# Patient Record
Sex: Female | Born: 1980
Health system: Southern US, Community
[De-identification: ages and names within clinical notes are randomized; demographics above are authoritative.]

## PROBLEM LIST (undated history)

## (undated) DIAGNOSIS — IMO0002 Reserved for concepts with insufficient information to code with codable children: Secondary | ICD-10-CM

## (undated) DIAGNOSIS — R590 Localized enlarged lymph nodes: Secondary | ICD-10-CM

## (undated) DIAGNOSIS — M1990 Unspecified osteoarthritis, unspecified site: Secondary | ICD-10-CM

## (undated) DIAGNOSIS — J302 Other seasonal allergic rhinitis: Secondary | ICD-10-CM

## (undated) DIAGNOSIS — Z973 Presence of spectacles and contact lenses: Secondary | ICD-10-CM

## (undated) DIAGNOSIS — R229 Localized swelling, mass and lump, unspecified: Secondary | ICD-10-CM

## (undated) HISTORY — DX: Unspecified osteoarthritis, unspecified site: M19.90

## (undated) HISTORY — PX: BREAST SURGERY: SHX581

## (undated) HISTORY — PX: REDUCTION MAMMAPLASTY: SUR839

---

## 2003-11-13 ENCOUNTER — Emergency Department (HOSPITAL_COMMUNITY): Admission: EM | Admit: 2003-11-13 | Discharge: 2003-11-13 | Payer: Self-pay | Admitting: Emergency Medicine

## 2003-12-04 HISTORY — PX: REDUCTION MAMMAPLASTY: SUR839

## 2005-08-19 ENCOUNTER — Emergency Department (HOSPITAL_COMMUNITY): Admission: EM | Admit: 2005-08-19 | Discharge: 2005-08-19 | Payer: Self-pay | Admitting: Emergency Medicine

## 2006-05-14 ENCOUNTER — Emergency Department (HOSPITAL_COMMUNITY): Admission: EM | Admit: 2006-05-14 | Discharge: 2006-05-14 | Payer: Self-pay | Admitting: Emergency Medicine

## 2006-08-22 ENCOUNTER — Other Ambulatory Visit: Admission: RE | Admit: 2006-08-22 | Discharge: 2006-08-22 | Payer: Self-pay | Admitting: *Deleted

## 2007-10-16 ENCOUNTER — Emergency Department (HOSPITAL_COMMUNITY): Admission: EM | Admit: 2007-10-16 | Discharge: 2007-10-16 | Payer: Self-pay | Admitting: Emergency Medicine

## 2011-09-11 LAB — RAPID STREP SCREEN (MED CTR MEBANE ONLY): Streptococcus, Group A Screen (Direct): NEGATIVE

## 2011-11-12 ENCOUNTER — Emergency Department (INDEPENDENT_AMBULATORY_CARE_PROVIDER_SITE_OTHER)
Admission: EM | Admit: 2011-11-12 | Discharge: 2011-11-12 | Disposition: A | Discharge status: Home / Self Care | Payer: 59 | Source: Home / Self Care | Attending: Family Medicine | Admitting: Family Medicine

## 2011-11-12 ENCOUNTER — Emergency Department (INDEPENDENT_AMBULATORY_CARE_PROVIDER_SITE_OTHER): Payer: 59

## 2011-11-12 DIAGNOSIS — J069 Acute upper respiratory infection, unspecified: Secondary | ICD-10-CM

## 2011-11-12 MED ORDER — GUAIFENESIN-CODEINE 100-10 MG/5ML PO SYRP: 10.0000 mL | ORAL_SOLUTION | Freq: Three times a day (TID) | ORAL | Status: AC | PRN

## 2011-11-12 MED ORDER — IPRATROPIUM BROMIDE 0.06 % NA SOLN: 2.0000 | Freq: Four times a day (QID) | NASAL | Status: DC

## 2011-11-12 NOTE — ED Notes (Signed)
C/o general body aches, ST, cough; minimal relief w OTC medications

## 2011-11-12 NOTE — ED Provider Notes (Signed)
History     CSN: [id number] Arrival date & time: 11/12/2011  3:21 PM   First MD Initiated Contact with Patient 11/12/11 1343      Chief Complaint  Patient presents with  . Cough    (Consider location/radiation/quality/duration/timing/severity/associated sxs/prior treatment) Patient is a 30 y.o. female presenting with cough. The history is provided by the patient.  Cough This is a new problem. The current episode started more than 1 week ago. The problem has not changed since onset.The cough is productive of sputum. The maximum temperature recorded prior to her arrival was 100 to 100.9 F. The fever has been present for 1 to 2 days. Associated symptoms include chest pain, ear pain, rhinorrhea, sore throat and myalgias. Pertinent negatives include no shortness of breath and no wheezing. She is not a smoker.    History reviewed. No pertinent past medical history.  Past Surgical History  Procedure Date  . Breast surgery     History reviewed. No pertinent family history.  History  Substance Use Topics  . Smoking status: Never Smoker   . Smokeless tobacco: Not on file  . Alcohol Use: Yes     occasional    OB History    Grav Para Term Preterm Abortions TAB SAB Ect Mult Living                  Review of Systems  Constitutional: Positive for fever.  HENT: Positive for ear pain, congestion, sore throat, rhinorrhea and postnasal drip.   Respiratory: Positive for cough. Negative for shortness of breath and wheezing.   Cardiovascular: Positive for chest pain.  Gastrointestinal: Negative.   Genitourinary: Negative.   Musculoskeletal: Positive for myalgias.  Skin: Negative.     Allergies  Review of patient's allergies indicates no known allergies.  Home Medications   Current Outpatient Rx  Name Route Sig Dispense Refill  . GUAIFENESIN-CODEINE 100-10 MG/5ML PO SYRP Oral Take 10 mLs by mouth 3 (three) times daily as needed for cough. 180 mL 0  . IPRATROPIUM BROMIDE 0.06 %  NA SOLN Nasal Place 2 sprays into the nose 4 (four) times daily. 15 mL 12    BP 139/98  Pulse 87  Temp(Src) 98.1 F (36.7 C) (Oral)  Resp 16  SpO2 97%  LMP 10/18/2011  Physical Exam  Nursing note and vitals reviewed. Constitutional: She appears well-developed and well-nourished.  HENT:  Head: Normocephalic.  Right Ear: External ear normal.  Left Ear: External ear normal.  Mouth/Throat: Oropharynx is clear and moist.  Eyes: Conjunctivae and EOM are normal. Pupils are equal, round, and reactive to light.  Neck: Normal range of motion. Neck supple.  Cardiovascular: Normal rate, normal heart sounds and intact distal pulses.   Pulmonary/Chest: Effort normal and breath sounds normal.  Abdominal: Soft. Bowel sounds are normal.  Skin: Skin is warm and dry.    ED Course  Procedures (including critical care time)  Labs Reviewed - No data to display Dg Chest 2 View  11/12/2011  *RADIOLOGY REPORT*  Clinical Data: Fever and congestion.  CHEST - 2 VIEW  Comparison: None.  Findings: No infiltrate, congestive heart failure or pneumothorax. Mild central pulmonary vessel prominence.  Heart size within normal limits.  IMPRESSION: No infiltrate.  Original Report Authenticated By: Fuller Canada, M.D.     1. Upper respiratory infection       MDM  X-rays reviewed and report per radiologist.         Barkley Bruns, MD 11/12/11 1630

## 2011-11-20 ENCOUNTER — Telehealth (HOSPITAL_COMMUNITY): Payer: Self-pay | Admitting: *Deleted

## 2015-08-11 ENCOUNTER — Other Ambulatory Visit (HOSPITAL_COMMUNITY): Payer: Self-pay | Admitting: Otolaryngology

## 2015-08-11 DIAGNOSIS — R221 Localized swelling, mass and lump, neck: Secondary | ICD-10-CM

## 2015-08-17 ENCOUNTER — Emergency Department (HOSPITAL_COMMUNITY): Payer: 59

## 2015-08-17 ENCOUNTER — Encounter (HOSPITAL_COMMUNITY): Payer: Self-pay

## 2015-08-17 ENCOUNTER — Ambulatory Visit (HOSPITAL_COMMUNITY)
Admission: RE | Admit: 2015-08-17 | Discharge: 2015-08-17 | Disposition: A | Discharge status: Home / Self Care | Payer: 59 | Source: Ambulatory Visit | Attending: Otolaryngology | Admitting: Otolaryngology

## 2015-08-17 ENCOUNTER — Emergency Department (HOSPITAL_COMMUNITY)
Admission: EM | Admit: 2015-08-17 | Discharge: 2015-08-17 | Disposition: A | Discharge status: Home / Self Care | Payer: 59 | Attending: Emergency Medicine | Admitting: Emergency Medicine

## 2015-08-17 DIAGNOSIS — J029 Acute pharyngitis, unspecified: Secondary | ICD-10-CM | POA: Diagnosis present

## 2015-08-17 DIAGNOSIS — Z79899 Other long term (current) drug therapy: Secondary | ICD-10-CM | POA: Diagnosis not present

## 2015-08-17 DIAGNOSIS — R599 Enlarged lymph nodes, unspecified: Secondary | ICD-10-CM

## 2015-08-17 DIAGNOSIS — R21 Rash and other nonspecific skin eruption: Secondary | ICD-10-CM

## 2015-08-17 DIAGNOSIS — Z792 Long term (current) use of antibiotics: Secondary | ICD-10-CM | POA: Insufficient documentation

## 2015-08-17 DIAGNOSIS — R221 Localized swelling, mass and lump, neck: Secondary | ICD-10-CM

## 2015-08-17 LAB — CBC WITH DIFFERENTIAL/PLATELET
Basophils Absolute: 0 K/uL (ref 0.0–0.1)
Basophils Relative: 0 %
Eosinophils Absolute: 0 K/uL (ref 0.0–0.7)
Eosinophils Relative: 0 %
HCT: 39.3 % (ref 36.0–46.0)
Hemoglobin: 13 g/dL (ref 12.0–15.0)
Lymphocytes Relative: 14 %
Lymphs Abs: 1.5 K/uL (ref 0.7–4.0)
MCH: 29.9 pg (ref 26.0–34.0)
MCHC: 33.1 g/dL (ref 30.0–36.0)
MCV: 90.3 fL (ref 78.0–100.0)
Monocytes Absolute: 0.5 K/uL (ref 0.1–1.0)
Monocytes Relative: 4 %
Neutro Abs: 8.7 K/uL — ABNORMAL HIGH (ref 1.7–7.7)
Neutrophils Relative %: 81 %
Platelets: 543 K/uL — ABNORMAL HIGH (ref 150–400)
RBC: 4.35 MIL/uL (ref 3.87–5.11)
RDW: 12.6 % (ref 11.5–15.5)
WBC: 10.7 K/uL — ABNORMAL HIGH (ref 4.0–10.5)

## 2015-08-17 LAB — I-STAT CG4 LACTIC ACID, ED: Lactic Acid, Venous: 1.24 mmol/L (ref 0.5–2.0)

## 2015-08-17 LAB — BASIC METABOLIC PANEL WITH GFR
Anion gap: 8 (ref 5–15)
BUN: 11 mg/dL (ref 6–20)
CO2: 26 mmol/L (ref 22–32)
Calcium: 9.1 mg/dL (ref 8.9–10.3)
Chloride: 102 mmol/L (ref 101–111)
Creatinine, Ser: 0.89 mg/dL (ref 0.44–1.00)
GFR calc Af Amer: >60 mL/min
GFR calc non Af Amer: >60 mL/min
Glucose, Bld: 127 mg/dL — ABNORMAL HIGH (ref 65–99)
Potassium: 3.8 mmol/L (ref 3.5–5.1)
Sodium: 136 mmol/L (ref 135–145)

## 2015-08-17 MED ORDER — TRIAMCINOLONE ACETONIDE 0.1 % EX CREA: 1.0000 "application " | TOPICAL_CREAM | Freq: Two times a day (BID) | CUTANEOUS | Status: DC

## 2015-08-17 MED ORDER — IOHEXOL 300 MG/ML  SOLN
75.0000 mL | Freq: Once | INTRAMUSCULAR | Status: AC | PRN
  Administered 2015-08-17: 75 mL via INTRAVENOUS

## 2015-08-17 MED ORDER — IOHEXOL 300 MG/ML  SOLN
75.0000 mL | Freq: Once | INTRAMUSCULAR | Status: DC | PRN
Start: 2015-08-17 — End: 2015-08-18

## 2015-08-17 NOTE — ED Provider Notes (Signed)
CSN: [id number]     Arrival date & time 08/17/15  1306 History   First MD Initiated Contact with Patient 08/17/15 1523     Chief Complaint  Patient presents with  . Sore Throat     (Consider location/radiation/quality/duration/timing/severity/associated sxs/prior Treatment) HPI Comments: Jennifer Gutierrez is a 34 y.o F with no significant past medical history presented to the emergency department today complaining of sore throat and rash. 2 weeks ago patient noticed a painful lump on her left neck so patient saw her PCP who tested her for mono and strep which are both negative. PCP place patient on Augmentin. Patient then saw ENT last Thursday. He biopsies of the nodule and place patient on doxycycline as well as prednisone. Patient states that beginning 2 weeks ago up until this past Monday she had intermittent fevers reaching 102. Patient states that this Monday she started noticing a rash that began on her bilateral lower extremities and trunk that she stop taking the doxycycline. Patient has never taken doxycycline before this time. Yesterday morning patient states the rash on her lower extremities got much worse. Her legs are now hot to the touch and bright red and swollen. Rash is mildly pruritic. Patient came to hospital to have CT of the neck done as ordered by ENT physician. However imaging tech was afraid to give her contrast dye due to her rash so she sent her to the emergency department. Patient denies active fever, chills, difficulty swallowing, difficulty breathing, chest pain, nausea, vomiting, diarrhea, weakness, numbness, tingling, headache, confusion.  The history is provided by the patient.    History reviewed. No pertinent past medical history. Past Surgical History  Procedure Laterality Date  . Breast surgery     No family history on file. Social History  Substance Use Topics  . Smoking status: Never Smoker   . Smokeless tobacco: None  . Alcohol Use: Yes     Comment:  occasional   OB History    No data available     Review of Systems  All other systems reviewed and are negative.     Allergies  Review of patient's allergies indicates no known allergies.  Home Medications   Prior to Admission medications   Medication Sig Start Date End Date Taking? Authorizing Provider  doxycycline (VIBRAMYCIN) 100 MG capsule Take 1 capsule by mouth 2 (two) times daily. 08/11/15  Yes Historical Provider, MD  HYDROcodone-acetaminophen (NORCO/VICODIN) 5-325 MG per tablet Take 1 tablet by mouth every 6 (six) hours as needed. pain 08/11/15  Yes Historical Provider, MD  NUVARING 0.12-0.015 MG/24HR vaginal ring Place 1 each vaginally every 28 (twenty-eight) days. 07/18/15  Yes Historical Provider, MD  predniSONE (DELTASONE) 10 MG tablet Take 1 tablet by mouth. Take 4 tabs daily x2 days, 3 tabs daily x2 days, 2 tabs daily x2 days, 1 tab daily x2 days 08/04/15  Yes Historical Provider, MD  ipratropium (ATROVENT) 0.06 % nasal spray Place 2 sprays into the nose 4 (four) times daily. 11/12/11 11/11/12  Linna Hoff, MD   BP 145/89 mmHg  Pulse 86  Temp(Src) 98.1 F (36.7 C) (Oral)  Resp 16  SpO2 100%  LMP 08/04/2015 (Approximate) Physical Exam  Constitutional: She is oriented to person, place, and time. She appears well-developed and well-nourished. No distress.  HENT:  Head: Normocephalic and atraumatic.  Mouth/Throat: Oropharynx is clear and moist. No oropharyngeal exudate.  No tonsilar exudate. Buccal mucosa non-erythematous.  Eyes: Conjunctivae and EOM are normal. Pupils are equal, round, and reactive  to light. Right eye exhibits no discharge. Left eye exhibits no discharge. No scleral icterus.  Neck: Normal range of motion. Neck supple. No JVD present. No tracheal deviation present. No thyromegaly present.  Nodule appreciated in left side of neck, with TTP. Non-fluctuant mass. Non-erythematous.   Cardiovascular: Normal rate, regular rhythm, normal heart sounds and intact  distal pulses.  Exam reveals no gallop and no friction rub.   No murmur heard. Pulmonary/Chest: Effort normal and breath sounds normal. No stridor. No respiratory distress. She has no wheezes. She has no rales. She exhibits no tenderness.  Abdominal: Soft. Bowel sounds are normal. She exhibits no distension and no mass. There is no tenderness. There is no rebound and no guarding.  Musculoskeletal: Normal range of motion. She exhibits no edema or tenderness.  Lymphadenopathy:    She has no cervical adenopathy.  Neurological: She is alert and oriented to person, place, and time. No cranial nerve deficit.  Strength 5/5 throughout. No sensory deficits.  No gait abnormality.   Skin: Skin is warm and dry. Rash noted. She is not diaphoretic. No erythema. No pallor.  Diffuse macular-papular rash across bilateral lower extremities and abdomen. Below the knee on bilateral LE rash is also beefy, circumferential, and warm to the touch.  Psychiatric: She has a normal mood and affect. Her behavior is normal.  Nursing note and vitals reviewed.   ED Course  Procedures (including critical care time) Labs Review Labs Reviewed  BASIC METABOLIC PANEL - Abnormal; Notable for the following:    Glucose, Bld 127 (*)    All other components within normal limits  CBC WITH DIFFERENTIAL/PLATELET - Abnormal; Notable for the following:    WBC 10.7 (*)    Platelets 543 (*)    Neutro Abs 8.7 (*)    All other components within normal limits  I-STAT CG4 LACTIC ACID, ED    Imaging Review Ct Soft Tissue Neck W Contrast  08/17/2015   CLINICAL DATA:  Left-sided neck swelling.  EXAM: CT NECK WITH CONTRAST  TECHNIQUE: Multidetector CT imaging of the neck was performed using the standard protocol following the bolus administration of intravenous contrast.  CONTRAST:  75mL OMNIPAQUE IOHEXOL 300 MG/ML  SOLN  COMPARISON:  None.  FINDINGS: Pharynx and larynx: Mild mucosal thickening is present at the tongue base. No discrete  lesion is present. The vocal cords are midline and symmetric.  Salivary glands: Small lymph nodes are present at the superior aspect of the parotid glands bilaterally. The salivary glands are otherwise within normal limits.  Thyroid: Negative  Lymph nodes: Multiple enlarged left cervical chain nodes are present. A large nodes are at the left level 3 station, measuring up to 15 x 7 mm. There is inflammatory change surrounding knees nodes. This creates some mass effect on the left internal jugular vein without obstruction. No discrete abscess is present. Smaller right-sided nodes are present without inflammatory stranding.  Vascular: External compression on the left internal jugular vein is noted without obstruction. No significant atherosclerotic changes are present.  Limited intracranial: Limited imaging the brain is within normal limits.  Visualized orbits: Within normal limits.  Mastoids and visualized paranasal sinuses: The paranasal sinuses and mastoid air cells are clear.  Skeleton: Negative.  Upper chest: Lung apices are clear. Inflammatory changes are present within the left middle and posterior scalene muscles. Laboratory changes are noted along the medial aspect of the left sternocleidomastoid muscle.  IMPRESSION: 1. Multiple reactive type left level 2, level 3, and level 4 lymph  nodes extending to the thoracic inlet. 2. Marked inflammatory changes are present between these nodes. 3. Inflammatory changes within the left middle and posterior scalene musculature suggesting myositis. No discrete abscess is evident. 4. Mild irregularity of the mucosa at the tongue base may represent a pharyngitis. There is no discrete abscess or mass lesion.   Electronically Signed   By: Marin Roberts M.D.   On: 08/17/2015 17:48   I have personally reviewed and evaluated these images and lab results as part of my medical decision-making.   EKG Interpretation None      MDM   Final diagnoses:  Rash  Lymph  nodes enlarged    Pt seen for bilateral LE rash likely due to an allergic reaction to doxycycline. Will discontinue. Spoke to Dr. Emeline Darling with ENT who recommends outpatient biopsy of neck nodule. Discontinuation of doxycycline,  continue taking prednisone and pain management pills as needed for pain. CT reveals enlarged lymph nodes in left neck. This has been addressed with ENT who will contact patient tomorrow to schedule outpatient biopsy. Vital signs stable. We will give triamcinolone cream for rash. Patient may take Benadryl at home when necessary. Discussed with patient who is agreeable. Return precautions outlined in patient discharge instructions.  Patient was discussed with  Dr. Clydene Pugh who agrees with the treatment plan.      Dub Mikes, PA-C 08/17/15 1825  Lester Kinsman [place], PA-C 08/17/15 1826  Lyndal Pulley, MD 08/18/15 [phone]

## 2015-08-17 NOTE — Progress Notes (Signed)
Patient was seen in the office last week for left neck swelling, had office FNA that was nondiagnostic. Went to have neck CT today and was sent to ER for a rash from her Doxycycline that she has since stopped. I reviewed her neck CT scan and this shows several enlarged left level 2-4 cervical nodes and an increased number of bilateral level 2-5 nodes, but no obvious thyroid or oral cavity masses on my review and no abscesses or cysts. Since the left neck mass appears to be lymphadenopathy without abscess I discussed with the ER provider that the patient can just stop her antibiotic, finish the prednisone taper as tolerated, and I will defer to the ER on treatment of the rash. I will contact the patient with the CT scan results once Radiology has finished the final reading. The patient will likely need an open biopsy of the left neck lymph nodes and my office can contact her to set this up as an outpatient. Plan was discussed with the ED physician.  Melvenia Beam, MD  5:39 PM  08/17/2015

## 2015-08-17 NOTE — Discharge Instructions (Signed)
Drug Rash Skin reactions can be caused by several different drugs. Allergy to the medicine can cause itching, hives, and other rashes. Sun exposure causes a red rash with some medicines. Mononucleosis virus can cause a similar red rash when you are taking antibiotics. Sometimes, the rash may be accompanied by pain. The drug rash may happen with new drugs or with medicines that you have been taking for a while. The rash cannot be spread from person to person. In most cases, the symptoms of a drug rash are gone within a few days of stopping the medicine. Your rash, including hives (urticaria), is most likely from the following medicines:  Antibiotics or antimicrobials.  Anticonvulsants or seizure medicines.  Antihypertensives or blood pressure medicines.  Antimalarials.  Antidepressants or depression medicines.  Antianxiety drugs.  Diuretics or water pills.  Nonsteroidal anti-inflammatory drugs.  Simvastatin.  Lithium.  Omeprazole.  Allopurinol.  Pseudoephedrine.  Amiodarone.  Packed red blood cells, when you get a blood transfusion.  Contrast media, such as when getting an imaging test (CT or CAT scan). This drug list is not all inclusive, but drug rashes have been reported with all the medicines listed above. Your caregiver will tell you which medicines to avoid. If you react to a medicine, a similar or worse reaction can occur the next time you take it. If you need to stop taking an antibiotic because of a drug rash, an alternative antibiotic may be needed to get rid of your infection. Antihistamine or cortisone drugs may be prescribed to help relieve your symptoms. Stay out of the sun until the rash is completely gone.  Be sure to let your caregiver know about your drug reaction. Do not take this medicine in the future. Call your caregiver if your drug rash does not improve within 3 to 4 days. SEEK IMMEDIATE MEDICAL CARE IF:   You develop breathing problems, swelling in the  throat, or wheezing.  You have weakness, fainting, fever, and muscle or joint pains.  You develop blisters or peeling of skin, especially around the mouth. Document Released: 12/27/2004 Document Revised: 04/05/2014 Document Reviewed: 10/07/2008 [facility] Patient Information 2015 [place], [place]. This information is not intended to replace advice given to you by your health care provider. Make sure you discuss any questions you have with your health care provider.  Drug Allergy A drug allergy means you have a strange reaction to a medicine. You may have puffiness (swelling), itching, red rashes, and hives. Some allergic reactions can be life-threatening. HOME CARE  If you do not know what caused your reaction:  Write down medicines you use.  Write down any problems you have after using medicine.  Avoid things that cause a reaction.  You can see an allergy doctor to be tested for allergies. If you have hives or a rash:  Take medicine as told by your doctor.  Place cold cloths on your skin.  Do not take hot baths or hot showers. Take baths in cool water. If you are severely allergic:  Wear a medical bracelet or necklace that lists your allergy.  Carry your allergy kit or medicine shot to treat severe allergic reactions with you. These can save your life.  Do not drive until medicine from your shot has worn off, unless your doctor says it is okay. GET HELP RIGHT AWAY IF:   Your mouth is puffy, or you have trouble breathing.  You have a tight feeling in your chest or throat.  You have hives, puffiness, or itching  all over your body.  You throw up (vomit) or have watery poop (diarrhea).  You feel dizzy or pass out (faint).  You think you are having a reaction. Problems often start within 30 minutes after taking a medicine.  You are getting worse, not better.  You have new problems.  Your problems go away and then come back. This is an emergency. Use your medicine shot or  allergy kit as told. Call yourlocal emergency services (911 in [place] after the shot. Even if you feel better after the shot, you need to go to the hospital. You may need more medicine to control a severe reaction. MAKE SURE YOU:  Understand these instructions.  Will watch your condition.  Will get help right away if you are not doing well or get worse. Document Released: 12/27/2004 Document Revised: 02/11/2012 Document Reviewed: 05/17/2011 [facility] Patient Information 2015 [place], [place]. This information is not intended to replace advice given to you by your health care provider. Make sure you discuss any questions you have with your health care provider.  Discontinue doxycycline. Continue taking prednisone and pain pills as needed. Follow up with ENT to schedule outpatient biopsy. Return to the emergency department if rash worsens or spreads or if you experience fevers, vomiting, diarrhea, difficulty swallowing, or difficulty breathing occur.

## 2015-08-17 NOTE — ED Notes (Signed)
She c/o swelling of left neck area which she feels on the inside of here neck.  She is having no difficulty with breathing or air movement.  She states the area of swelling on her neck has appeared over the past two weeks.  She states she saw her pcp and an ent and is currently on doxy and pain med.  She has a red, beefy rash of bilat. Lower extremities and a red rash to a muc lesser extent of trunk and upper extremities.

## 2015-09-05 ENCOUNTER — Other Ambulatory Visit (HOSPITAL_COMMUNITY): Payer: Self-pay | Admitting: Otolaryngology

## 2015-09-14 ENCOUNTER — Encounter (HOSPITAL_COMMUNITY)
Admission: RE | Admit: 2015-09-14 | Discharge: 2015-09-14 | Disposition: A | Discharge status: Home / Self Care | Payer: 59 | Source: Ambulatory Visit | Attending: Otolaryngology | Admitting: Otolaryngology

## 2015-09-14 ENCOUNTER — Encounter (HOSPITAL_COMMUNITY): Payer: Self-pay

## 2015-09-14 DIAGNOSIS — R590 Localized enlarged lymph nodes: Secondary | ICD-10-CM | POA: Diagnosis not present

## 2015-09-14 HISTORY — DX: Localized enlarged lymph nodes: R59.0

## 2015-09-14 HISTORY — DX: Other seasonal allergic rhinitis: J30.2

## 2015-09-14 HISTORY — DX: Presence of spectacles and contact lenses: Z97.3

## 2015-09-14 HISTORY — DX: Localized swelling, mass and lump, unspecified: R22.9

## 2015-09-14 HISTORY — DX: Reserved for concepts with insufficient information to code with codable children: IMO0002

## 2015-09-14 LAB — CBC
HCT: 35.5 % — ABNORMAL LOW (ref 36.0–46.0)
Hemoglobin: 11.9 g/dL — ABNORMAL LOW (ref 12.0–15.0)
MCH: 29.8 pg (ref 26.0–34.0)
MCHC: 33.5 g/dL (ref 30.0–36.0)
MCV: 89 fL (ref 78.0–100.0)
Platelets: 541 10*3/uL — ABNORMAL HIGH (ref 150–400)
RBC: 3.99 MIL/uL (ref 3.87–5.11)
RDW: 13.3 % (ref 11.5–15.5)
WBC: 7 10*3/uL (ref 4.0–10.5)

## 2015-09-14 LAB — HCG, SERUM, QUALITATIVE: Preg, Serum: NEGATIVE

## 2015-09-14 NOTE — Pre-Procedure Instructions (Signed)
    Jennifer Gutierrez  09/14/2015      [facility] [id number] [place]- [place], [place] - [address] AT [facility] [address] [facility] [place] [id number] Phone: [phone] Fax: [phone]    Your procedure is scheduled on Friday, September 16, 2015  [facility]  Call this number if you have problems the morning of surgery:  [phone]   Remember:  Do not eat food or drink liquids after midnight Thursday, September 15, 2015  Take these medicines the morning of surgery with A SIP OF WATER: None  Stop taking Aspirin, vitamins and herbal medications. Do not take any NSAIDs ie: Ibuprofen, Advil, Naproxen or any medication containing Aspirin; stop now.   Do not wear jewelry, make-up or nail polish.  Do not wear lotions, powders, or perfumes.  You may not wear deodorant.  Do not shave 48 hours prior to surgery.  Men may shave face and neck.  Do not bring valuables to the hospital.  [facility] is not responsible for any belongings or valuables.  Contacts, dentures or bridgework may not be worn into surgery.  Leave your suitcase in the car.  After surgery it may be brought to your room.  For patients admitted to the hospital, discharge time will be determined by your treatment team.  Patients discharged the day of surgery will not be allowed to drive home.   Name and phone number of your driver:   Special instructions:   Please read over the following fact sheets that you were given. Pain Booklet, Coughing and Deep Breathing and Surgical Site Infection Prevention

## 2015-09-15 NOTE — H&P (Signed)
09/16/15 7:20 AM  Jennifer Gutierrez, Jennifer Gutierrez  PREOPERATIVE HISTORY AND PHYSICAL  CHIEF COMPLAINT: left neck lymphadenopathy  HISTORY: This is a 34 year old who presents with persistent left neck lymphadenopathy that did not completely resolve with antibiotics and steroids.  She now presents for open left neck lymph node biopsy.  Dr. Emeline DarlingGore, Clovis RileyMitchell has discussed the risks (bleeding, infection, nerve injury, anesthesia risks, etc.), benefits, and alternatives of this procedure. The patient understands the risks and would like to proceed with the procedure. The chances of success of the procedure are >50% and the patient understands this. I personally performed an examination of the patient within 24 hours of the procedure.  PAST MEDICAL HISTORY: Past Medical History  Diagnosis Date  . Mass     left side of neck  . Cervical lymphadenopathy   . Wears glasses   . Seasonal allergies     PAST SURGICAL HISTORY: Past Surgical History  Procedure Laterality Date  . Breast surgery      reduction    MEDICATIONS: No current facility-administered medications on file prior to encounter.   Current Outpatient Prescriptions on File Prior to Encounter  Medication Sig Dispense Refill  . NUVARING 0.12-0.015 MG/24HR vaginal ring Place 1 each vaginally every 28 (twenty-eight) days.  13  . triamcinolone cream (KENALOG) 0.1 % Apply 1 application topically 2 (two) times daily. (Patient not taking: Reported on 09/13/2015) 30 g 0    ALLERGIES: Allergies  Allergen Reactions  . Doxycycline Hyclate Rash    SOCIAL HISTORY: Social History   Social History  . Marital Status: Single    Spouse Name: N/A  . Number of Children: N/A  . Years of Education: N/A   Occupational History  . Not on file.   Social History Main Topics  . Smoking status: Never Smoker   . Smokeless tobacco: Never Used  . Alcohol Use: Yes     Comment: occasional  . Drug Use: No  . Sexual Activity: Yes    Birth Control/ Protection:  Abstinence   Other Topics Concern  . Not on file   Social History Narrative    FAMILY HISTORY:  Family History  Problem Relation Age of Onset  . Cancer Mother   . Hypertension Mother   . Glaucoma Father   . Hypertension Father   . Leukemia Other     REVIEW OF SYSTEMS:  HEENT:left neck pain and swelling improved since original presentation, otherwise negative x 12 systems except per HPI   PHYSICAL EXAM:  GENERAL: NAD  VITAL SIGNS:   SKIN:  Warm, dry HEENT:  Oral cavity clear  NECK/LYMPH: several mildly tender ~ 1 to 2cm level II through IV nodes on the left  ABDOMEN:  soft MUSCULOSKELETAL: normal strength PSYCH:  Normal affect NEUROLOGIC:  CN 2-12 intact and symmetric  DIAGNOSTIC STUDIES: CT neck showed several enlarged left neck jugulodigastric nodes  ASSESSMENT AND PLAN: Plan to proceed with open left neck lymph node biopsy. Patient understands the risks, benefits, and alternatives. Informed written consent signed witnessed and on chart.  09/16/15 Jennifer Gutierrez, Jennifer Gutierrez  7:22 AM

## 2015-09-16 ENCOUNTER — Encounter (HOSPITAL_COMMUNITY)
Admission: RE | Disposition: A | Discharge status: Home / Self Care | Payer: Self-pay | Source: Ambulatory Visit | Attending: Otolaryngology

## 2015-09-16 ENCOUNTER — Ambulatory Visit (HOSPITAL_COMMUNITY): Payer: 59 | Admitting: Anesthesiology

## 2015-09-16 ENCOUNTER — Encounter (HOSPITAL_COMMUNITY): Payer: Self-pay | Admitting: Anesthesiology

## 2015-09-16 ENCOUNTER — Ambulatory Visit (HOSPITAL_COMMUNITY)
Admission: RE | Admit: 2015-09-16 | Discharge: 2015-09-16 | Disposition: A | Discharge status: Home / Self Care | Payer: 59 | Source: Ambulatory Visit | Attending: Otolaryngology | Admitting: Otolaryngology

## 2015-09-16 DIAGNOSIS — R590 Localized enlarged lymph nodes: Secondary | ICD-10-CM | POA: Insufficient documentation

## 2015-09-16 HISTORY — PX: LYMPH NODE BIOPSY: SHX201

## 2015-09-16 SURGERY — LYMPH NODE BIOPSY
Anesthesia: General | Site: Neck | Laterality: Left

## 2015-09-16 MED ORDER — KETOROLAC TROMETHAMINE 30 MG/ML IJ SOLN
INTRAMUSCULAR | Status: DC | PRN
  Administered 2015-09-16: 30 mg via INTRAVENOUS

## 2015-09-16 MED ORDER — PROPOFOL 10 MG/ML IV BOLUS
INTRAVENOUS | Status: AC
  Filled 2015-09-16: qty 20

## 2015-09-16 MED ORDER — LIDOCAINE-EPINEPHRINE 1 %-1:100000 IJ SOLN
INTRAMUSCULAR | Status: DC | PRN
  Administered 2015-09-16: 10 mL

## 2015-09-16 MED ORDER — ROCURONIUM BROMIDE 50 MG/5ML IV SOLN
INTRAVENOUS | Status: AC
  Filled 2015-09-16: qty 1

## 2015-09-16 MED ORDER — LIDOCAINE HCL (CARDIAC) 20 MG/ML IV SOLN
INTRAVENOUS | Status: DC | PRN
  Administered 2015-09-16: 80 mg via INTRAVENOUS

## 2015-09-16 MED ORDER — SUCCINYLCHOLINE 20MG/ML (10ML) SYRINGE FOR MEDFUSION PUMP - OPTIME
INTRAMUSCULAR | Status: DC | PRN
  Administered 2015-09-16: 100 mg via INTRAVENOUS

## 2015-09-16 MED ORDER — ONDANSETRON HCL 4 MG/2ML IJ SOLN
INTRAMUSCULAR | Status: DC | PRN
  Administered 2015-09-16: 4 mg via INTRAVENOUS

## 2015-09-16 MED ORDER — 0.9 % SODIUM CHLORIDE (POUR BTL) OPTIME
TOPICAL | Status: DC | PRN
  Administered 2015-09-16: 1000 mL

## 2015-09-16 MED ORDER — BACITRACIN ZINC 500 UNIT/GM EX OINT
TOPICAL_OINTMENT | CUTANEOUS | Status: DC | PRN
  Administered 2015-09-16: 1 via TOPICAL

## 2015-09-16 MED ORDER — SUCCINYLCHOLINE CHLORIDE 20 MG/ML IJ SOLN
INTRAMUSCULAR | Status: AC
  Filled 2015-09-16: qty 1

## 2015-09-16 MED ORDER — BACITRACIN ZINC 500 UNIT/GM EX OINT
TOPICAL_OINTMENT | CUTANEOUS | Status: AC
  Filled 2015-09-16: qty 28.35

## 2015-09-16 MED ORDER — PHENYLEPHRINE HCL 10 MG/ML IJ SOLN
10000.0000 ug | INTRAMUSCULAR | Status: DC | PRN
  Administered 2015-09-16: 80 ug via INTRAVENOUS
  Administered 2015-09-16 (×2): 40 ug via INTRAVENOUS
  Administered 2015-09-16: 80 ug via INTRAVENOUS

## 2015-09-16 MED ORDER — HEMOSTATIC AGENTS (NO CHARGE) OPTIME
TOPICAL | Status: DC | PRN
  Administered 2015-09-16: 1 via TOPICAL

## 2015-09-16 MED ORDER — MIDAZOLAM HCL 2 MG/2ML IJ SOLN
INTRAMUSCULAR | Status: AC
  Filled 2015-09-16: qty 4

## 2015-09-16 MED ORDER — FENTANYL CITRATE (PF) 250 MCG/5ML IJ SOLN
INTRAMUSCULAR | Status: AC
  Filled 2015-09-16: qty 5

## 2015-09-16 MED ORDER — LACTATED RINGERS IV SOLN
INTRAVENOUS | Status: DC | PRN
  Administered 2015-09-16: 08:00:00 via INTRAVENOUS

## 2015-09-16 MED ORDER — CLINDAMYCIN PHOSPHATE 600 MG/50ML IV SOLN
600.0000 mg | Freq: Once | INTRAVENOUS | Status: AC
  Administered 2015-09-16: 600 mg via INTRAVENOUS
  Filled 2015-09-16 (×2): qty 50

## 2015-09-16 MED ORDER — LIDOCAINE-EPINEPHRINE 1 %-1:100000 IJ SOLN
INTRAMUSCULAR | Status: AC
  Filled 2015-09-16: qty 1

## 2015-09-16 MED ORDER — LIDOCAINE HCL (CARDIAC) 20 MG/ML IV SOLN
INTRAVENOUS | Status: AC
  Filled 2015-09-16: qty 5

## 2015-09-16 MED ORDER — EPHEDRINE SULFATE 50 MG/ML IJ SOLN
INTRAMUSCULAR | Status: AC
  Filled 2015-09-16: qty 1

## 2015-09-16 MED ORDER — ONDANSETRON HCL 4 MG/2ML IJ SOLN
INTRAMUSCULAR | Status: AC
  Filled 2015-09-16: qty 2

## 2015-09-16 MED ORDER — DEXAMETHASONE SODIUM PHOSPHATE 10 MG/ML IJ SOLN
INTRAMUSCULAR | Status: DC | PRN
  Administered 2015-09-16: 10 mg via INTRAVENOUS

## 2015-09-16 MED ORDER — MIDAZOLAM HCL 5 MG/5ML IJ SOLN
INTRAMUSCULAR | Status: DC | PRN
  Administered 2015-09-16: 2 mg via INTRAVENOUS

## 2015-09-16 MED ORDER — DEXAMETHASONE SODIUM PHOSPHATE 10 MG/ML IJ SOLN
INTRAMUSCULAR | Status: AC
  Filled 2015-09-16: qty 1

## 2015-09-16 MED ORDER — LACTATED RINGERS IV SOLN
INTRAVENOUS | Status: DC
  Administered 2015-09-16: 07:00:00 via INTRAVENOUS

## 2015-09-16 MED ORDER — FENTANYL CITRATE (PF) 100 MCG/2ML IJ SOLN
INTRAMUSCULAR | Status: DC | PRN
  Administered 2015-09-16: 50 ug via INTRAVENOUS
  Administered 2015-09-16: 100 ug via INTRAVENOUS
  Administered 2015-09-16: 150 ug via INTRAVENOUS
  Administered 2015-09-16: 100 ug via INTRAVENOUS

## 2015-09-16 MED ORDER — PHENYLEPHRINE 40 MCG/ML (10ML) SYRINGE FOR IV PUSH (FOR BLOOD PRESSURE SUPPORT)
PREFILLED_SYRINGE | INTRAVENOUS | Status: AC
  Filled 2015-09-16: qty 10

## 2015-09-16 MED ORDER — DEXAMETHASONE SODIUM PHOSPHATE 10 MG/ML IJ SOLN
10.0000 mg | Freq: Once | INTRAMUSCULAR | Status: DC
  Filled 2015-09-16: qty 1

## 2015-09-16 MED ORDER — HYDROMORPHONE HCL 1 MG/ML IJ SOLN: 0.2500 mg | INTRAMUSCULAR | Status: DC | PRN

## 2015-09-16 MED ORDER — SODIUM CHLORIDE 0.9 % IJ SOLN
INTRAMUSCULAR | Status: AC
  Filled 2015-09-16: qty 10

## 2015-09-16 MED ORDER — KETOROLAC TROMETHAMINE 30 MG/ML IJ SOLN
INTRAMUSCULAR | Status: AC
  Filled 2015-09-16: qty 1

## 2015-09-16 SURGICAL SUPPLY — 57 items
AIRSTRIP 4 3/4X3 1/4 7185 (GAUZE/BANDAGES/DRESSINGS) IMPLANT
ATTRACTOMAT 16X20 MAGNETIC DRP (DRAPES) IMPLANT
BLADE SURG 15 STRL LF DISP TIS (BLADE) IMPLANT
BLADE SURG 15 STRL SS (BLADE)
BNDG CONFORM 2 STRL LF (GAUZE/BANDAGES/DRESSINGS) IMPLANT
BNDG GAUZE ELAST 4 BULKY (GAUZE/BANDAGES/DRESSINGS) IMPLANT
CANISTER SUCTION 2500CC (MISCELLANEOUS) IMPLANT
CATH ROBINSON RED A/P 16FR (CATHETERS) IMPLANT
CLEANER TIP ELECTROSURG 2X2 (MISCELLANEOUS) ×2 IMPLANT
CONT SPEC 4OZ CLIKSEAL STRL BL (MISCELLANEOUS) IMPLANT
COVER SURGICAL LIGHT HANDLE (MISCELLANEOUS) ×2 IMPLANT
DRAIN PENROSE 1/4X12 LTX STRL (WOUND CARE) IMPLANT
DRAPE PROXIMA HALF (DRAPES) IMPLANT
DRSG EMULSION OIL 3X3 NADH (GAUZE/BANDAGES/DRESSINGS) IMPLANT
ELECT COATED BLADE 2.86 ST (ELECTRODE) ×2 IMPLANT
ELECT NEEDLE TIP 2.8 STRL (NEEDLE) IMPLANT
ELECT REM PT RETURN 9FT ADLT (ELECTROSURGICAL) ×2
ELECTRODE REM PT RTRN 9FT ADLT (ELECTROSURGICAL) ×1 IMPLANT
GAUZE SPONGE 4X4 12PLY STRL (GAUZE/BANDAGES/DRESSINGS) IMPLANT
GAUZE SPONGE 4X4 16PLY XRAY LF (GAUZE/BANDAGES/DRESSINGS) IMPLANT
GLOVE BIO SURGEON STRL SZ8 (GLOVE) ×2 IMPLANT
GLOVE BIOGEL PI IND STRL 6.5 (GLOVE) ×1 IMPLANT
GLOVE BIOGEL PI INDICATOR 6.5 (GLOVE) ×1
GLOVE SS BIOGEL STRL SZ 6.5 (GLOVE) ×1 IMPLANT
GLOVE SUPERSENSE BIOGEL SZ 6.5 (GLOVE) ×1
GLOVE SURG SS PI 7.5 STRL IVOR (GLOVE) ×2 IMPLANT
GOWN STRL REUS W/ TWL LRG LVL3 (GOWN DISPOSABLE) ×2 IMPLANT
GOWN STRL REUS W/TWL LRG LVL3 (GOWN DISPOSABLE) ×2
HEMOSTAT SURGICEL 2X14 (HEMOSTASIS) ×2 IMPLANT
KIT BASIN OR (CUSTOM PROCEDURE TRAY) ×2 IMPLANT
KIT ROOM TURNOVER OR (KITS) ×2 IMPLANT
LIQUID BAND (GAUZE/BANDAGES/DRESSINGS) ×2 IMPLANT
NEEDLE 25GX 5/8IN NON SAFETY (NEEDLE) IMPLANT
NEEDLE HYPO 25GX1X1/2 BEV (NEEDLE) IMPLANT
NS IRRIG 1000ML POUR BTL (IV SOLUTION) ×2 IMPLANT
PAD ARMBOARD 7.5X6 YLW CONV (MISCELLANEOUS) ×4 IMPLANT
PENCIL FOOT CONTROL (ELECTRODE) ×2 IMPLANT
POUCH STERILIZING 3 X22 (STERILIZATION PRODUCTS) IMPLANT
STAPLER VISISTAT 35W (STAPLE) IMPLANT
SUT CHROMIC 4 0 P 3 18 (SUTURE) IMPLANT
SUT ETHILON 4 0 PS 2 18 (SUTURE) IMPLANT
SUT ETHILON 5 0 P 3 18 (SUTURE)
SUT NYLON ETHILON 5-0 P-3 1X18 (SUTURE) IMPLANT
SUT SILK 2 0 (SUTURE) ×1
SUT SILK 2 0 SH CR/8 (SUTURE) ×2 IMPLANT
SUT SILK 2-0 18XBRD TIE 12 (SUTURE) ×1 IMPLANT
SUT SILK 4 0 (SUTURE)
SUT SILK 4-0 18XBRD TIE 12 (SUTURE) IMPLANT
SUT VIC AB 3-0 SH 18 (SUTURE) ×2 IMPLANT
SWAB COLLECTION DEVICE MRSA (MISCELLANEOUS) IMPLANT
SYR BULB IRRIGATION 50ML (SYRINGE) IMPLANT
SYR TB 1ML LUER SLIP (SYRINGE) IMPLANT
TOWEL OR 17X24 6PK STRL BLUE (TOWEL DISPOSABLE) ×2 IMPLANT
TRAY ENT MC OR (CUSTOM PROCEDURE TRAY) ×2 IMPLANT
TUBE ANAEROBIC SPECIMEN COL (MISCELLANEOUS) IMPLANT
WATER STERILE IRR 1000ML POUR (IV SOLUTION) ×2 IMPLANT
YANKAUER SUCT BULB TIP NO VENT (SUCTIONS) IMPLANT

## 2015-09-16 NOTE — Anesthesia Procedure Notes (Signed)
Procedure Name: Intubation Date/Time: 09/16/2015 8:03 AM Performed by: Olive Motyka, Jannet AskewHARLESETTA M Pre-anesthesia Checklist: Patient identified, Timeout performed, Emergency Drugs available, Suction available and Patient being monitored Patient Re-evaluated:Patient Re-evaluated prior to inductionOxygen Delivery Method: Circle system utilized Preoxygenation: Pre-oxygenation with 100% oxygen Intubation Type: IV induction Ventilation: Mask ventilation without difficulty Laryngoscope Size: Mac and 3 Grade View: Grade I Tube size: 7.0 mm Number of attempts: 1 Placement Confirmation: ETT inserted through vocal cords under direct vision,  breath sounds checked- equal and bilateral and positive ETCO2 Secured at: 22 cm Dental Injury: Teeth and Oropharynx as per pre-operative assessment

## 2015-09-16 NOTE — Anesthesia Postprocedure Evaluation (Signed)
  Anesthesia Post-op Note  Patient: Jennifer Gutierrez  Procedure(s) Performed: Procedure(s) with comments: Left neck LYMPH NODE BIOPSY (Left) - Open left side of  neck BX  Patient Location: PACU  Anesthesia Type:General  Level of Consciousness: awake and alert   Airway and Oxygen Therapy: Patient Spontanous Breathing  Post-op Pain: Controlled  Post-op Assessment: Post-op Vital signs reviewed, Patient's Cardiovascular Status Stable and Respiratory Function Stable  Post-op Vital Signs: Reviewed  Filed Vitals:   09/16/15 0952  BP: 130/79  Pulse: 94  Temp:   Resp: 19    Complications: No apparent anesthesia complications

## 2015-09-16 NOTE — Transfer of Care (Signed)
Immediate Anesthesia Transfer of Care Note  Patient: Jennifer Gutierrez  Procedure(s) Performed: Procedure(s) with comments: Left neck LYMPH NODE BIOPSY (Left) - Open left side of  neck BX  Patient Location: PACU  Anesthesia Type:General  Level of Consciousness: awake, alert  and oriented  Airway & Oxygen Therapy: Patient Spontanous Breathing and Patient connected to nasal cannula oxygen  Post-op Assessment: Report given to RN and Post -op Vital signs reviewed and stable  Post vital signs: Reviewed and stable  Last Vitals:  Filed Vitals:   09/16/15 0658  BP: 142/93  Pulse: 102  Temp: 36.8 C  Resp: 20    Complications: No apparent anesthesia complications

## 2015-09-16 NOTE — Op Note (Signed)
DATE OF OPERATION: 09/16/2015 Surgeon: Melvenia BeamGore, Alynna Hargrove Procedure Performed: 38510-Left: left neck open lymph node biopsy  PREOPERATIVE DIAGNOSIS: left neck lymphadenopathy POSTOPERATIVE DIAGNOSIS: left neck lymphadenopathy  SURGEON: Melvenia BeamGore, Natalija Mavis ANESTHESIA: General endotracheal.  ESTIMATED BLOOD LOSS: minimal.  DRAINS: none SPECIMENS: left neck open lymph node biopsy INDICATIONS: The patient is a 34yo with a history of left neck lymphadenopathy that improved but did not completely resolve with antibiotic and steroids so left neck open biopsy was scheduled. DESCRIPTION OF OPERATION: The patient was brought into the operating room and placed on the OR table in a supine position. The patient was intubated without difficulty. The patient was prepped and draped in a sterile fashion. A small 3cm apron  incision was then made in the left neck using the 15-blade in a natural skin crease. The incision was carried down through subcutaneous tissues and platysmal muscle with the use of electrocautery. A limited subplatysmal flap was elevated superiorly and inferiorly and retracted with 2-0 silks. The left external jugular vein was  dssected out and ligated. I stayed superficial to the phrenic nerve and stayed inferior to the spinal accessory nerve. The Bovie and mosquito were used to dissect out and remove fatty and lymph node tissue from level II and III deep to the left sternocleidomastoid muscle and this nodal/fatty tissue was removed and passed off as left neck lymph node biopsy. The left internal jugular vein was identified and preserved. A piece of surgicel was placed within the depths of the wound after hemostasis was confirmed. The skin flaps were released and returned to their normal anatomic position. The flaps were then closed in layers, first closing the platysmal/skin layer with interrupted sutures of 3-0 Vicryl and closing the skin with Dermabond. The procedure was then ended with the patient  tolerating the procedure well. The patient was transported to the recovery room in good condition.    Dr. Melvenia BeamMitchell Langdon Crosson was present and performed the entire procedure. 09/16/2015  9:06 AM Melvenia BeamGore, Hiroshi Krummel

## 2015-09-16 NOTE — Discharge Instructions (Signed)
Follow up with Dr. Emeline DarlingGore in 1 week. Incision has dermabond so keep dry x 48 hours and then clean gently with soap and water as needed. Rx for hydrocodone given to family, Rx for antibiotic and zofran sent to pharmacy.

## 2015-09-16 NOTE — Anesthesia Preprocedure Evaluation (Addendum)
Anesthesia Evaluation  Patient identified by MRN, date of birth, ID band Patient awake    Reviewed: Allergy & Precautions, H&P , NPO status , Patient's Chart, lab work & pertinent test results  Airway Mallampati: II  TM Distance: >3 FB Neck ROM: Full    Dental no notable dental hx. (+) Teeth Intact, Dental Advisory Given   Pulmonary neg pulmonary ROS,    Pulmonary exam normal breath sounds clear to auscultation       Cardiovascular negative cardio ROS   Rhythm:Regular Rate:Normal     Neuro/Psych negative neurological ROS  negative psych ROS   GI/Hepatic negative GI ROS, Neg liver ROS,   Endo/Other  negative endocrine ROS  Renal/GU negative Renal ROS  negative genitourinary   Musculoskeletal   Abdominal   Peds  Hematology negative hematology ROS (+)   Anesthesia Other Findings   Reproductive/Obstetrics negative OB ROS                            Anesthesia Physical Anesthesia Plan  ASA: II  Anesthesia Plan: General   Post-op Pain Management:    Induction: Intravenous  Airway Management Planned: Oral ETT  Additional Equipment:   Intra-op Plan:   Post-operative Plan: Extubation in OR  Informed Consent: I have reviewed the patients History and Physical, chart, labs and discussed the procedure including the risks, benefits and alternatives for the proposed anesthesia with the patient or authorized representative who has indicated his/her understanding and acceptance.   Dental advisory given  Plan Discussed with: CRNA  Anesthesia Plan Comments:         Anesthesia Quick Evaluation  

## 2015-09-19 ENCOUNTER — Encounter (HOSPITAL_COMMUNITY): Payer: Self-pay | Admitting: Otolaryngology

## 2017-09-23 ENCOUNTER — Encounter: Payer: Self-pay | Admitting: Family Medicine

## 2017-09-23 ENCOUNTER — Ambulatory Visit (INDEPENDENT_AMBULATORY_CARE_PROVIDER_SITE_OTHER): Payer: 59 | Admitting: Family Medicine

## 2017-09-23 VITALS — BP 122/80 | HR 79 | Temp 98.1°F | Resp 12 | Ht 68.0 in | Wt 274.0 lb

## 2017-09-23 DIAGNOSIS — Z6841 Body Mass Index (BMI) 40.0 and over, adult: Secondary | ICD-10-CM | POA: Diagnosis not present

## 2017-09-23 DIAGNOSIS — L219 Seborrheic dermatitis, unspecified: Secondary | ICD-10-CM | POA: Diagnosis not present

## 2017-09-23 DIAGNOSIS — J309 Allergic rhinitis, unspecified: Secondary | ICD-10-CM

## 2017-09-23 DIAGNOSIS — H60543 Acute eczematoid otitis externa, bilateral: Secondary | ICD-10-CM | POA: Diagnosis not present

## 2017-09-23 DIAGNOSIS — Z23 Encounter for immunization: Secondary | ICD-10-CM

## 2017-09-23 DIAGNOSIS — E66813 Obesity, class 3: Secondary | ICD-10-CM | POA: Insufficient documentation

## 2017-09-23 LAB — TSH: TSH: 1.99 u[IU]/mL (ref 0.35–4.50)

## 2017-09-23 MED ORDER — HYDROCORTISONE-ACETIC ACID 1-2 % OT SOLN: 3.0000 [drp] | Freq: Every day | OTIC | 1 refills | Status: DC | PRN

## 2017-09-23 MED ORDER — CLOBETASOL PROPIONATE 0.05 % EX GEL: 1.0000 "application " | Freq: Every day | CUTANEOUS | 2 refills | Status: DC | PRN

## 2017-09-23 MED ORDER — FLUTICASONE PROPIONATE 50 MCG/ACT NA SUSP: 1.0000 | Freq: Two times a day (BID) | NASAL | 4 refills | Status: DC

## 2017-09-23 MED ORDER — KETOCONAZOLE 2 % EX SHAM: 1.0000 "application " | MEDICATED_SHAMPOO | CUTANEOUS | 6 refills | Status: DC

## 2017-09-23 NOTE — Progress Notes (Signed)
HPI:   [name] is a 36 y.o. female, who is here today to establish care.  Former [facility] Last preventive routine visit: Gyn preventive annually and lab work screening sat work, last one 07/26/17. Next week appt with her gyn.  Chronic medical problems: Right knee pain,allergic rhinitis,obesity, and thyroid nodules. S/P thyroid nodules removal.   Concerns today:   Obesity: She needs a form completed for work, biometric she failed , BMI. She has FLP,FG,and HgA1C and she reports these as normal.   She has tried [employer] and able to lose wt (up to 30 Lb) but she gains some back. She has not been consistent with a healthy diet, tried to aim for 1200 cl/day. She exercises regularly, aquatic exercises 2/week and elliptical every other day.She has limitations in regard to exercise because Hx of right knee pain, planing on meniscal surgery. She denies hx of PCOS, states that her gyn checked and labs were "fine."   "Scaly" scalp for a meny years, pruritus. She has used OTC Hair and Shoulders shampoo with temporal improvement. She has not noted scalp lesions or associated hair loss.  Ear canal dryness, occasional pruritus. She denies earache or hearing loss. She has not used OTC medication. Problem has been intermittent for years. She has not identified alleviating or exacerbating factors.  "Allergies": Nasal congestion,rhinorrhea,and post nasal drainage. Symptoms are intermittent during the years, she has "always" had these symptoms. Worse with weather changes but not sure if a season is worse that another one. She is not on any OTC antihistaminic.   Review of Systems  Constitutional: Negative for activity change, appetite change, fatigue and fever.  HENT: Positive for congestion, postnasal drip, rhinorrhea and sneezing. Negative for ear discharge, ear pain, mouth sores, nosebleeds, sore throat, trouble swallowing and voice change.   Eyes: Negative for  redness and visual disturbance.  Respiratory: Negative for cough, shortness of breath and wheezing.   Cardiovascular: Negative for chest pain, palpitations and leg swelling.  Gastrointestinal: Negative for abdominal pain, nausea and vomiting.       Negative for changes in bowel habits.  Endocrine: Negative for cold intolerance, heat intolerance, polydipsia, polyphagia and polyuria.  Genitourinary: Negative for decreased urine volume, dysuria and hematuria.  Musculoskeletal: Positive for arthralgias. Negative for myalgias.  Skin: Negative for pallor and rash.  Allergic/Immunologic: Positive for environmental allergies.  Neurological: Negative for syncope, weakness and headaches.  Hematological: Negative for adenopathy. Does not bruise/bleed easily.  Psychiatric/Behavioral: Negative for confusion. The patient is not nervous/anxious.       Current Outpatient Prescriptions on File Prior to Visit  Medication Sig Dispense Refill  . NUVARING 0.12-0.015 MG/24HR vaginal ring Place 1 each vaginally every 28 (twenty-eight) days.  13   No current facility-administered medications on file prior to visit.      Past Medical History:  Diagnosis Date  . Arthritis   . Cervical lymphadenopathy   . Mass    left side of neck  . Seasonal allergies   . Wears glasses    Allergies  Allergen Reactions  . Doxycycline Hyclate Rash    Family History  Problem Relation Age of Onset  . Cancer Mother   . Hypertension Mother   . Glaucoma Father   . Hypertension Father   . Leukemia Other     Social History   Social History  . Marital status: Single    Spouse name: N/A  . Number of children: N/A  . Years of education:  N/A   Social History Main Topics  . Smoking status: Never Smoker  . Smokeless tobacco: Never Used  . Alcohol use Yes     Comment: occasional  . Drug use: No  . Sexual activity: Yes    Partners: Female    Birth control/ protection: Abstinence   Other Topics Concern  . None    Social History Narrative  . None    Vitals:   09/23/17 1200  BP: 122/80  Pulse: 79  Resp: 12  Temp: 98.1 F (36.7 C)  SpO2: 98%    Body mass index is 41.66 kg/m.   Physical Exam  Nursing note and vitals reviewed. Constitutional: She is oriented to person, place, and time. She appears well-developed. No distress.  HENT:  Head: Normocephalic and atraumatic.  Right Ear: Hearing, external ear and ear canal normal. Tympanic membrane is not erythematous and not bulging. A middle ear effusion is present.  Left Ear: Hearing, tympanic membrane, external ear and ear canal normal.  Mouth/Throat: Oropharynx is clear and moist and mucous membranes are normal.  Hypertrophic turbinates. No cerumen in ear canals, bilateral.  Eyes: Pupils are equal, round, and reactive to light. Conjunctivae are normal.  Neck: No tracheal deviation present. No thyroid mass and no thyromegaly (palpable) present.  Cardiovascular: Normal rate and regular rhythm.   No murmur heard. Pulses:      Dorsalis pedis pulses are 2+ on the right side, and 2+ on the left side.  Respiratory: Effort normal and breath sounds normal. No respiratory distress.  GI: Soft. She exhibits no mass. There is no hepatomegaly. There is no tenderness.  Musculoskeletal: She exhibits no edema or tenderness.  Lymphadenopathy:    She has no cervical adenopathy.  Neurological: She is alert and oriented to person, place, and time. She has normal strength. Coordination and gait normal.  Skin: Skin is warm. No rash noted. No erythema.  Scalp with no erythematous lesions, scaly fine patchy areas on frontal,parietal,and temporal scalp. Also fine scales on eye brows and eye lashes.   Psychiatric: She has a normal mood and affect.  Well groomed, good eye contact.    ASSESSMENT AND PLAN:  [name] was seen today for establish care.  Diagnoses and all orders for this visit:  Dermatitis of ear canal, bilateral  Chronic. Topical  Hydrocortisone-Acetic acid may help.Explained that I am not sure about insurance coverage, if not covered she can try OTC Hydrocortisone lotion 1-2 drops as needed daily. F/U as needed.  -     acetic acid-hydrocortisone (VOSOL-HC) OTIC solution; Place 3 drops into both ears daily as needed.  Seborrheic dermatitis  Educated about Dx and differential Dx's to consider. Ketoconazole shampoo and topical steroid recommended. Side effects of topical steroid discussed. F/U in 5 months, before if needed.  -     ketoconazole (NIZORAL) 2 % shampoo; Apply 1 application topically 2 (two) times a week. -     clobetasol (TEMOVATE) 0.05 % GEL; Apply 1 application topically daily as needed.  Allergic rhinitis, unspecified seasonality, unspecified trigger  OTC antihistaminic recommended. Nasal irrigations with saline as needed. Flonase intranasal spray, some side effects discussed. F/U in 5 months,before if needed.  -     fluticasone (FLONASE) 50 MCG/ACT nasal spray; Place 1 spray into both nostrils 2 (two) times daily.  Class 3 severe obesity without serious comorbidity with body mass index (BMI) of 40.0 to 44.9 in adult, unspecified obesity type (HCC)  We discussed benefits of wt loss as well as  adverse effects of obesity. Consistency with healthy diet and physical activity recommended. Continue Weight Watchers 1800 cal/day and continue regular exercise. F/U in 5 months, may consider pharmacologic treatment. Form completed.  -     TSH  Need for influenza vaccination -     Flu Vaccine QUAD 36+ mos IM       Jemma Rasp G. [place], MD  [facility]. [place] office.

## 2017-09-23 NOTE — Patient Instructions (Signed)
A few things to remember from today's visit:   Dermatitis of ear canal, bilateral - Plan: acetic acid-hydrocortisone (VOSOL-HC) OTIC solution  Seborrheic dermatitis - Plan: ketoconazole (NIZORAL) 2 % shampoo, clobetasol (TEMOVATE) 0.05 % GEL  Allergic rhinitis, unspecified seasonality, unspecified trigger - Plan: fluticasone (FLONASE) 50 MCG/ACT nasal spray  Class 3 severe obesity without serious comorbidity with body mass index (BMI) of 40.0 to 44.9 in adult, unspecified obesity type (HCC)  What are some tips for weight loss? People become overweight for many reasons. Weight issues can run in families. They can be caused by unhealthy behaviors and a person's environment. Certain health problems and medicines can also lead to weight gain. There are some simple things you can do to reach and maintain a healthy weight:  Eat small more frequent healthy meals instead 3 bid meals. Also Weight Watchers is a good option, so recommend continuing. 1800 cal day  Avoid sweet drinks. These include regular soft drinks, fruit juices, fruit drinks, energy drinks, sweetened iced tea, and flavored milk. Avoid fast foods. Fast foods such as french fries, hamburgers, chicken nuggets, and pizza are high in calories and can cause weight gain. Eat a healthy breakfast. People who skip breakfast tend to weigh more. Don't watch more than two hours of television per day. Chew sugar-free gum between meals to cut down on snacking. Avoid grocery shopping when you're hungry. Pack a healthy lunch instead of eating out to control what and how much you eat. Eat a lot of fruits and vegetables. Aim for about 2 cups of fruit and 2 to 3 cups of vegetables per day. Aim for 150 minutes per week of moderate-intensity exercise (such as brisk walking), or 75 minutes per week of vigorous exercise (such as jogging or running). OR 15-30 min of daily brisk walking. Be more active. Small changes in physical activity can easily be added  to your daily routine. For example, take the stairs instead of the elevator. Take a walk with your family. A daily walk is a great way to get exercise and to catch up on the day's events.   Please be sure medication list is accurate. If a new problem present, please set up appointment sooner than planned today.

## 2017-09-25 ENCOUNTER — Encounter: Payer: Self-pay | Admitting: Family Medicine

## 2018-02-20 NOTE — Progress Notes (Signed)
HPI:   [name] is a 37 y.o. female, who is here today for 5 months follow up.   She was last seen on 09/23/17. Seborrheic dermatitis: She is on Ketoconazole shampoo and Clobetasol gel.  Scalp pruritus and flakiness has improved. No side effects reported.  Allergic rhinitis: She is on Flonase nasal spray, which was controlling symptoms until 2 weeks ago. Symptoms aggravated by seasonal changes. She is not taking OTC antihistaminic.   No cough,dyspnea,or wheezing.  No sick contact.   Fatigue: Complaining of 2 months at least of feeling tired. No Hx of sleep apnea. No unusual stress, anxiety,or depression.  She has not noted fever,chills, night sweats,or abnormal wt loss.  Obesity: She wants to know if she can follow keto diet. She is not exercising regularly due to right knee pain. She has not been consistent with a healthy diet.  Glucose in 2016 127, no Hx of DM.  Review of Systems  Constitutional: Positive for fatigue. Negative for activity change, appetite change, fever and unexpected weight change.  HENT: Positive for congestion, postnasal drip and rhinorrhea. Negative for mouth sores, nosebleeds, sore throat and trouble swallowing.   Eyes: Negative for redness and visual disturbance.  Respiratory: Negative for cough, shortness of breath and wheezing.   Cardiovascular: Negative for chest pain, palpitations and leg swelling.  Gastrointestinal: Negative for abdominal pain, nausea and vomiting.       Negative for changes in bowel habits.  Endocrine: Negative for cold intolerance, heat intolerance, polydipsia, polyphagia and polyuria.  Genitourinary: Negative for decreased urine volume, difficulty urinating, dysuria and hematuria.  Musculoskeletal: Negative for gait problem and myalgias.  Skin: Negative for rash and wound.  Allergic/Immunologic: Positive for environmental allergies.  Neurological: Negative for syncope, weakness and headaches.    Hematological: Negative for adenopathy. Does not bruise/bleed easily.      Current Outpatient Medications on File Prior to Visit  Medication Sig Dispense Refill  . acetic acid-hydrocortisone (VOSOL-HC) OTIC solution Place 3 drops into both ears daily as needed. 10 mL 1  . clobetasol (TEMOVATE) 0.05 % GEL Apply 1 application topically daily as needed. 30 each 2  . meloxicam (MOBIC) 7.5 MG tablet 1 tab daily    . NUVARING 0.12-0.015 MG/24HR vaginal ring Place 1 each vaginally every 28 (twenty-eight) days.  13   No current facility-administered medications on file prior to visit.      Past Medical History:  Diagnosis Date  . Arthritis   . Cervical lymphadenopathy   . Mass    left side of neck  . Seasonal allergies   . Wears glasses    Allergies  Allergen Reactions  . Doxycycline Hyclate Rash    Social History   Socioeconomic History  . Marital status: Single    Spouse name: Not on file  . Number of children: Not on file  . Years of education: Not on file  . Highest education level: Not on file  Occupational History  . Not on file  Social Needs  . Financial resource strain: Not on file  . Food insecurity:    Worry: Not on file    Inability: Not on file  . Transportation needs:    Medical: Not on file    Non-medical: Not on file  Tobacco Use  . Smoking status: Never Smoker  . Smokeless tobacco: Never Used  Substance and Sexual Activity  . Alcohol use: Yes    Comment: occasional  . Drug use: No  . Sexual  activity: Yes    Partners: Female    Birth control/protection: Abstinence  Lifestyle  . Physical activity:    Days per week: Not on file    Minutes per session: Not on file  . Stress: Not on file  Relationships  . Social connections:    Talks on phone: Not on file    Gets together: Not on file    Attends religious service: Not on file    Active member of club or organization: Not on file    Attends meetings of clubs or organizations: Not on file     Relationship status: Not on file  Other Topics Concern  . Not on file  Social History Narrative  . Not on file    Vitals:   02/21/18 1212  BP: 125/80  Pulse: 94  Resp: 12  Temp: 98.5 F (36.9 C)  SpO2: 95%   Body mass index is 42.88 kg/m. Wt Readings from Last 3 Encounters:  02/21/18 282 lb (127.9 kg)  09/23/17 274 lb (124.3 kg)  09/16/15 232 lb (105.2 kg)     Physical Exam  Nursing note and vitals reviewed. Constitutional: She is oriented to person, place, and time. She appears well-developed. No distress.  HENT:  Head: Normocephalic and atraumatic.  Mouth/Throat: Oropharynx is clear and moist and mucous membranes are normal.  Hypertrophic turbinates. Postnasal drainage.  Eyes: Pupils are equal, round, and reactive to light. Conjunctivae are normal.  Neck: No tracheal deviation present. No thyroid mass and no thyromegaly present.  Cardiovascular: Normal rate and regular rhythm.  No murmur heard. Pulses:      Dorsalis pedis pulses are 2+ on the right side, and 2+ on the left side.  Respiratory: Effort normal and breath sounds normal. No respiratory distress.  GI: Soft. She exhibits no mass. There is no hepatomegaly. There is no tenderness.  Musculoskeletal: She exhibits no edema.  Lymphadenopathy:    She has no cervical adenopathy.  Neurological: She is alert and oriented to person, place, and time. She has normal strength. Coordination normal.  Skin: Skin is warm. No erythema.  Psychiatric: She has a normal mood and affect.  Well groomed, good eye contact.       ASSESSMENT AND PLAN:   [name] was seen today for 5 months follow-up.  Orders Placed This Encounter  Procedures  . Basic metabolic panel  . CBC  . Hemoglobin A1c   Lab Results  Component Value Date   WBC 5.2 02/21/2018   HGB 12.9 02/21/2018   HCT 38.3 02/21/2018   MCV 93.4 02/21/2018   PLT 355.0 02/21/2018   Lab Results  Component Value Date   HGBA1C 5.5 02/21/2018   Lab  Results  Component Value Date   CREATININE 0.72 02/21/2018   BUN 9 02/21/2018   NA 136 02/21/2018   K 4.1 02/21/2018   CL 102 02/21/2018   CO2 27 02/21/2018    Class 3 severe obesity with body mass index (BMI) of 40.0 to 44.9 in adult [facility](HCC) She gained about 8 Lb since her last visit. We discussed benefits of wt loss as well as adverse effects of obesity. Consistency with healthy diet and physical activity recommended. I do not recommend Keto diet for long term, if she wants to try she can do so for 3 months. Weight Watchers is a good option as well as aily brisk walking for 15-30 min as tolerated.   Allergic rhinitis Not well controlled. She was instructed to start OTC Zyrtec  10 mg daily Am. Singulair 10 mg daily added. She can continue Singulair until the beginning of Summer and resume during Fall and Spring. Nasal irrigations with saline as needed. F/U annually.  Seborrheic dermatitis Improved. No changes in current management. F/U in 12 months.    Hyperglycemia  Primary prevention of DM through a healthy diet and regular physical activity recommended. Further recommendations will be given according to HgA1C results.   Fatigue, unspecified type  Denies prior Hx fatigue. We discussed possible etiologies: Systemic illness, immunologic,endocrinology,sleep disorder, psychiatric/psychologic, infectious,medications side effects, and idiopathic. Examination today does not suggest a serious process. Healthy diet and regular physical activity may help.  We will continue following.      -[name] was advised to return sooner than planned today if new concerns arise.       Betty G. [place], MD  [facility]. [place] office.

## 2018-02-21 ENCOUNTER — Encounter: Payer: Self-pay | Admitting: Family Medicine

## 2018-02-21 ENCOUNTER — Ambulatory Visit: Payer: BLUE CROSS/BLUE SHIELD | Admitting: Family Medicine

## 2018-02-21 VITALS — BP 125/80 | HR 94 | Temp 98.5°F | Resp 12 | Ht 68.0 in | Wt 282.0 lb

## 2018-02-21 DIAGNOSIS — E66813 Obesity, class 3: Secondary | ICD-10-CM

## 2018-02-21 DIAGNOSIS — Z6841 Body Mass Index (BMI) 40.0 and over, adult: Secondary | ICD-10-CM

## 2018-02-21 DIAGNOSIS — R739 Hyperglycemia, unspecified: Secondary | ICD-10-CM | POA: Diagnosis not present

## 2018-02-21 DIAGNOSIS — L219 Seborrheic dermatitis, unspecified: Secondary | ICD-10-CM | POA: Diagnosis not present

## 2018-02-21 DIAGNOSIS — R5383 Other fatigue: Secondary | ICD-10-CM

## 2018-02-21 DIAGNOSIS — J309 Allergic rhinitis, unspecified: Secondary | ICD-10-CM

## 2018-02-21 LAB — BASIC METABOLIC PANEL
BUN: 9 mg/dL (ref 6–23)
CHLORIDE: 102 meq/L (ref 96–112)
CO2: 27 meq/L (ref 19–32)
Calcium: 8.8 mg/dL (ref 8.4–10.5)
Creatinine, Ser: 0.72 mg/dL (ref 0.40–1.20)
GFR: 117.45 mL/min (ref >60.00)
Glucose, Bld: 88 mg/dL (ref 70–99)
Potassium: 4.1 mEq/L (ref 3.5–5.1)
Sodium: 136 mEq/L (ref 135–145)

## 2018-02-21 LAB — CBC
HEMATOCRIT: 38.3 % (ref 36.0–46.0)
Hemoglobin: 12.9 g/dL (ref 12.0–15.0)
MCHC: 33.6 g/dL (ref 30.0–36.0)
MCV: 93.4 fl (ref 78.0–100.0)
PLATELETS: 355 10*3/uL (ref 150.0–400.0)
RBC: 4.1 Mil/uL (ref 3.87–5.11)
RDW: 13.2 % (ref 11.5–15.5)
WBC: 5.2 10*3/uL (ref 4.0–10.5)

## 2018-02-21 LAB — HEMOGLOBIN A1C: HEMOGLOBIN A1C: 5.5 % (ref 4.6–6.5)

## 2018-02-21 MED ORDER — MONTELUKAST SODIUM 10 MG PO TABS: 10.0000 mg | ORAL_TABLET | Freq: Every day | ORAL | 1 refills | Status: DC

## 2018-02-21 MED ORDER — FLUTICASONE PROPIONATE 50 MCG/ACT NA SUSP: 1.0000 | Freq: Two times a day (BID) | NASAL | 11 refills | Status: DC

## 2018-02-21 MED ORDER — KETOCONAZOLE 2 % EX SHAM: 1.0000 "application " | MEDICATED_SHAMPOO | CUTANEOUS | 6 refills | Status: DC

## 2018-02-21 NOTE — Assessment & Plan Note (Signed)
Improved. No changes in current management. F/U in 12 months.

## 2018-02-21 NOTE — Assessment & Plan Note (Addendum)
Not well controlled. She was instructed to start OTC Zyrtec 10 mg daily Am. Singulair 10 mg daily added. She can continue Singulair until the beginning of Summer and resume during Fall and Spring. Nasal irrigations with saline as needed. F/U annually.

## 2018-02-21 NOTE — Patient Instructions (Signed)
A few things to remember from today's visit:   Allergic rhinitis, unspecified seasonality, unspecified trigger  Seborrheic dermatitis  Fatigue, unspecified type - Plan: Basic metabolic panel, CBC, Hemoglobin A1c  Hyperglycemia - Plan: Hemoglobin A1c Today we will add Zyrtec 10 mg daily and Singulair 10 mg at bedtime. Continue Flonase nasal spray. Nasal irrigation with saline daily.  What are some tips for weight loss? People become overweight for many reasons. Weight issues can run in families. They can be caused by unhealthy behaviors and a person's environment. Certain health problems and medicines can also lead to weight gain. There are some simple things you can do to reach and maintain a healthy weight:  Eat small more frequent healthy meals instead 3 bid meals. Also Weight Watchers is a good option. Avoid sweet drinks. These include regular soft drinks, fruit juices, fruit drinks, energy drinks, sweetened iced tea, and flavored milk. Avoid fast foods. Fast foods such as french fries, hamburgers, chicken nuggets, and pizza are high in calories and can cause weight gain. Eat a healthy breakfast. People who skip breakfast tend to weigh more. Don't watch more than two hours of television per day. Chew sugar-free gum between meals to cut down on snacking. Avoid grocery shopping when you're hungry. Pack a healthy lunch instead of eating out to control what and how much you eat. Eat a lot of fruits and vegetables. Aim for about 2 cups of fruit and 2 to 3 cups of vegetables per day. Aim for 150 minutes per week of moderate-intensity exercise (such as brisk walking), or 75 minutes per week of vigorous exercise (such as jogging or running). OR 15-30 min of daily brisk walking. Be more active. Small changes in physical activity can easily be added to your daily routine. For example, take the stairs instead of the elevator. Take a walk with your family. A daily walk is a great way to get  exercise and to catch up on the day's events.    Please be sure medication list is accurate. If a new problem present, please set up appointment sooner than planned today.

## 2018-02-21 NOTE — Assessment & Plan Note (Signed)
She gained about 8 Lb since her last visit. We discussed benefits of wt loss as well as adverse effects of obesity. Consistency with healthy diet and physical activity recommended. I do not recommend Keto diet for long term, if she wants to try she can do so for 3 months. Weight Watchers is a good option as well as aily brisk walking for 15-30 min as tolerated.

## 2018-02-23 ENCOUNTER — Encounter: Payer: Self-pay | Admitting: Family Medicine

## 2018-07-28 ENCOUNTER — Encounter: Payer: BLUE CROSS/BLUE SHIELD | Admitting: Family Medicine

## 2018-10-07 DIAGNOSIS — Z01419 Encounter for gynecological examination (general) (routine) without abnormal findings: Secondary | ICD-10-CM | POA: Diagnosis not present

## 2018-10-07 DIAGNOSIS — Z6841 Body Mass Index (BMI) 40.0 and over, adult: Secondary | ICD-10-CM | POA: Diagnosis not present

## 2019-02-28 ENCOUNTER — Other Ambulatory Visit: Payer: Self-pay | Admitting: Family Medicine

## 2019-02-28 DIAGNOSIS — J309 Allergic rhinitis, unspecified: Secondary | ICD-10-CM

## 2019-02-28 DIAGNOSIS — L219 Seborrheic dermatitis, unspecified: Secondary | ICD-10-CM

## 2019-03-11 ENCOUNTER — Other Ambulatory Visit: Payer: Self-pay | Admitting: Family Medicine

## 2019-08-24 ENCOUNTER — Encounter: Payer: Self-pay | Admitting: Family Medicine

## 2019-08-24 ENCOUNTER — Ambulatory Visit: Payer: Managed Care, Other (non HMO) | Admitting: Family Medicine

## 2019-08-24 ENCOUNTER — Other Ambulatory Visit: Payer: Self-pay

## 2019-08-24 VITALS — BP 126/82 | HR 87 | Temp 97.8°F | Resp 12 | Ht 68.0 in | Wt 282.4 lb

## 2019-08-24 DIAGNOSIS — K909 Intestinal malabsorption, unspecified: Secondary | ICD-10-CM | POA: Diagnosis not present

## 2019-08-24 DIAGNOSIS — R109 Unspecified abdominal pain: Secondary | ICD-10-CM | POA: Diagnosis not present

## 2019-08-24 DIAGNOSIS — R197 Diarrhea, unspecified: Secondary | ICD-10-CM

## 2019-08-24 DIAGNOSIS — Z23 Encounter for immunization: Secondary | ICD-10-CM | POA: Diagnosis not present

## 2019-08-24 NOTE — Progress Notes (Addendum)
HPI:  Chief Complaint  Patient presents with  . Diarrhea    loose stools when eating and drinking, started 2 or 3 months ago  . Gastroesophageal Reflux    started 2 or 3 months ago    [name] is a 38 y.o. female, who is here today complaining of 2 to 3 months of intermittent episodes of loose stools and RLQ abdominal pain. No associated heartburn,nausea, or vomiting. + Burping and bloating sensation.  Sometimes she has urgency, afraid of not making it to the bathroom.  Problem is exacerbated by food or liquid intake but it does not happen daily. She has not identified alleviating factors.  "Naggy" RLQ abdominal pain, constant,no radiated. She has not identified exacerbating or alleviating factors.  Pain does not interfere with daily activities. Denies antibiotic use and then past 6 months. Negative for travel or changes in diet. No sick contact.  She denies fever, chills, night sweats, abnormal weight loss, blood in the stool, melena, or urinary symptoms. She may have about 2-3 stools in an hour. Yesterday she had watery stool, softer than her normal. She has also noted fat floating after defecation.  Negative for associated skin rash or arthralgias.  Review of Systems  Constitutional: Negative for activity change, appetite change and fatigue.  HENT: Negative for mouth sores, sore throat and trouble swallowing.   Respiratory: Negative for cough, shortness of breath and wheezing.   Cardiovascular: Negative for chest pain, palpitations and leg swelling.  Endocrine: Negative for cold intolerance and heat intolerance.  Genitourinary: Negative for decreased urine volume, difficulty urinating, dysuria and hematuria.  Musculoskeletal: Negative for gait problem and myalgias.  Neurological: Negative for syncope, weakness and headaches.  Rest see pertinent positives and negatives per HPI.   Current Outpatient Medications on File Prior to Visit  Medication Sig  Dispense Refill  . acetic acid-hydrocortisone (VOSOL-HC) OTIC solution Place 3 drops into both ears daily as needed. 10 mL 1  . clobetasol (TEMOVATE) 0.05 % GEL Apply 1 application topically daily as needed. 30 each 2  . fluticasone (FLONASE) 50 MCG/ACT nasal spray Place 1 spray into both nostrils 2 (two) times daily. 16 g 11  . ketoconazole (NIZORAL) 2 % shampoo Apply 1 application topically 2 (two) times a week. 120 mL 6  . levocetirizine (XYZAL) 5 MG tablet TK 1 T PO QHS    . meloxicam (MOBIC) 7.5 MG tablet 1 tab daily    . montelukast (SINGULAIR) 10 MG tablet Take 1 tablet (10 mg total) by mouth at bedtime. 90 tablet 1  . NUVARING 0.12-0.015 MG/24HR vaginal ring Place 1 each vaginally every 28 (twenty-eight) days.  13   No current facility-administered medications on file prior to visit.      Past Medical History:  Diagnosis Date  . Arthritis   . Cervical lymphadenopathy   . Mass    left side of neck  . Seasonal allergies   . Wears glasses    Allergies  Allergen Reactions  . Doxycycline Hyclate Rash    Social History   Socioeconomic History  . Marital status: Single    Spouse name: Not on file  . Number of children: Not on file  . Years of education: Not on file  . Highest education level: Not on file  Occupational History  . Not on file  Social Needs  . Financial resource strain: Not on file  . Food insecurity    Worry: Not on file  Inability: Not on file  . Transportation needs    Medical: Not on file    Non-medical: Not on file  Tobacco Use  . Smoking status: Never Smoker  . Smokeless tobacco: Never Used  Substance and Sexual Activity  . Alcohol use: Yes    Comment: occasional  . Drug use: No  . Sexual activity: Yes    Partners: Female    Birth control/protection: Abstinence  Lifestyle  . Physical activity    Days per week: Not on file    Minutes per session: Not on file  . Stress: Not on file  Relationships  . Social Herbalist on  phone: Not on file    Gets together: Not on file    Attends religious service: Not on file    Active member of club or organization: Not on file    Attends meetings of clubs or organizations: Not on file    Relationship status: Not on file  Other Topics Concern  . Not on file  Social History Narrative  . Not on file    Vitals:   08/24/19 0849  BP: 126/82  Pulse: 87  Resp: 12  Temp: 97.8 F (36.6 C)  SpO2: 98%   Body mass index is 42.93 kg/m.  Wt Readings from Last 3 Encounters:  08/24/19 282 lb 6 oz (128.1 kg)  02/21/18 282 lb (127.9 kg)  09/23/17 274 lb (124.3 kg)    Physical Exam  Nursing note and vitals reviewed. Constitutional: She is oriented to person, place, and time. She appears well-developed. No distress.  HENT:  Head: Normocephalic and atraumatic.  Mouth/Throat: Oropharynx is clear and moist and mucous membranes are normal.  Eyes: Pupils are equal, round, and reactive to light. Conjunctivae are normal.  Cardiovascular: Normal rate and regular rhythm.  No murmur heard. Pulses:      Dorsalis pedis pulses are 2+ on the right side and 2+ on the left side.  Respiratory: Effort normal and breath sounds normal. No respiratory distress.  GI: Soft. She exhibits no mass. There is no hepatomegaly. There is no abdominal tenderness.  Musculoskeletal:        General: No edema.  Lymphadenopathy:    She has no cervical adenopathy.  Neurological: She is alert and oriented to person, place, and time. She has normal strength. No cranial nerve deficit. Gait normal.  Skin: Skin is warm. No rash noted. No erythema.  Psychiatric: She has a normal mood and affect.  Well groomed, good eye contact.   ASSESSMENT AND PLAN:   Ms. Jimmie was seen today for diarrhea and gastroesophageal reflux.  Diagnoses and all orders for this visit: Orders Placed This Encounter  Procedures  . Stool culture  . Ova and parasite examination  . US Abdomen Limited RUQ  . CBC with  Differential/Platelet  . Comprehensive metabolic panel  . C-reactive protein  . HIV Antibody (routine testing w rflx)  . Lipase  . Sedimentation rate  . TSH   Lab Results  Component Value Date   ALT 15 08/24/2019   AST 14 08/24/2019   ALKPHOS 78 08/24/2019   BILITOT 0.3 08/24/2019   Lab Results  Component Value Date   CREATININE 0.78 08/24/2019   BUN 9 08/24/2019   NA 138 08/24/2019   K 4.4 08/24/2019   CL 99 08/24/2019   CO2 25 08/24/2019    Lab Results  Component Value Date   TSH 1.730 08/24/2019   Lab Results  Component Value Date  ESRSEDRATE 35 (H) 08/24/2019   Lab Results  Component Value Date   CRP 10 08/24/2019   Lab Results  Component Value Date   WBC 6.3 08/24/2019   HGB 13.5 08/24/2019   HCT 39.0 08/24/2019   MCV 90 08/24/2019   PLT 328 08/24/2019     Diarrhea, unspecified type Possible etiologies discussed. Adequate hydration. Further recommendations will be given according lab results. Recommend trial of lactose-free diet for 3 to 4 weeks, then gluten-free diet.  Right sided abdominal pain RLQ pain not elicited on examination today. History and examination today do not suggest a serious process. Instructed about warning signs.  -     US Abdomen Limited RUQ; Future  Steatorrhea We discussed possible etiologies. Instructed about warning signs. Further recommendation will be given according to lab results.  -     US Abdomen Limited RUQ; Future -     Cancel: Lipase -     Lipase  If work-up is negative, we can consider Dyann KiefQuestran,Doxepin, and/or GI referral.    Jeremiyah Cullens G. [place], MD  [facility]. [place] office.

## 2019-08-24 NOTE — Patient Instructions (Signed)
A few things to remember from today's visit:   Diarrhea, unspecified type - Plan: Comprehensive metabolic panel, TSH, CBC with Differential/Platelet, Sedimentation rate, C-reactive protein, US Abdomen Limited RUQ, HIV Antibody (routine testing w rflx), Stool culture, Ova and parasite examination, Lipase  Right sided abdominal pain - Plan: US Abdomen Limited RUQ  Steatorrhea - Plan: US Abdomen Limited RUQ, Lipase  Chronic Diarrhea Diarrhea is a condition in which a person passes frequent loose and watery stools. It can cause you to feel weak and dehydrated. Dehydration can make you tired and thirsty. It can also cause a dry mouth, decreased urination, and dark yellow urine. Diarrhea is a sign of another underlying problem, such as:  Infection.  Medication side effects.  Dietary intolerance, such as lactose intolerance.  Conditions such as celiac disease, irritable bowel syndrome (IBS), or inflammatory bowel disease (IBD). In most cases, diarrhea lasts 2-3 days. Diarrhea that lasts longer than 4 weeks is called long-lasting (chronic) diarrhea. It is important that you treat your diarrhea as told by your health care provider. Follow these instructions at home: Follow these recommendations as told by your health care provider. Eating and drinking  Take an oral rehydration solution (ORS). This is a drink that is designed to keep you hydrated. It can be found at pharmacies and retail stores.  Drink clear fluids, such as water, ice chips, diluted fruit juice, and low-calorie sports drinks.  Follow the diet recommended by your health care provider. You may need to avoid foods that trigger diarrhea for you.  Avoid foods and beverages that contain a lot of sugar or caffeine.  Avoid alcohol.  Avoid spicy or fatty foods. General instructions      Drink enough fluid to keep your urine clear or pale yellow.  Wash your hands often and after each diarrhea episode. If soap and water are not  available, use hand sanitizer.  Make sure that all people in your household wash their hands well and often.  Take over-the-counter and prescription medicines only as told by your health care provider.  If you were prescribed an antibiotic medicine, take it as told by your health care provider. Do not stop taking the antibiotic even if you start to feel better.  Rest at home while you recover.  Watch your condition for any changes.  Take a warm bath to relieve any burning or pain from frequent diarrhea episodes.  Keep all follow-up visits as told by your health care provider. This is important. Contact a health care provider if:  You have a fever.  Your diarrhea gets worse or does not get better.  You have new symptoms.  You cannot drink fluids without vomiting.  You feel light-headed or dizzy.  You have a headache.  You have muscle cramps.  You have severe pain in the rectum. Get help right away if:  You have persistent vomiting.  You have chest pain.  You feel extremely weak or you faint.  You have bloody or black stools, or stools that look like tar.  You have severe pain, cramping, or bloating in your abdomen, or pain that stays in one place.  You have trouble breathing or you are breathing very quickly.  Your heart is beating very quickly.  Your skin feels cold and clammy.  You feel confused.  You have a severe headache.  You have signs of dehydration, such as: ? Dark urine, very little urine, or no urine. ? Cracked lips. ? Dry mouth. ? Sunken eyes. ?  Sleepiness. ? Weakness. Summary  Chronic diarrhea is a condition in which a person passes frequent loose and watery stools for more than 4 weeks.  Diarrhea is a sign of another underlying problem.  Drink enough fluid to keep your urine clear or pale yellow to avoid dehydration.  Wash your hands often and after each diarrhea episode. If soap and water are not available, use hand sanitizer.  It  is important that you treat your diarrhea as told by your health care provider. This information is not intended to replace advice given to you by your health care provider. Make sure you discuss any questions you have with your health care provider. Document Released: 02/09/2004 Document Revised: 02/14/2018 Document Reviewed: 10/08/2016 Elsevier Patient Education  [place].   Please be sure medication list is accurate. If a new problem present, please set up appointment sooner than planned today.

## 2019-08-25 ENCOUNTER — Encounter: Payer: Self-pay | Admitting: Family Medicine

## 2019-08-25 LAB — COMPREHENSIVE METABOLIC PANEL
ALT: 15 IU/L (ref 0–32)
AST: 14 IU/L (ref 0–40)
Albumin/Globulin Ratio: 1.3 (ref 1.2–2.2)
Albumin: 4.1 g/dL (ref 3.8–4.8)
Alkaline Phosphatase: 78 IU/L (ref 39–117)
BUN/Creatinine Ratio: 12 (ref 9–23)
BUN: 9 mg/dL (ref 6–20)
Bilirubin Total: 0.3 mg/dL (ref 0.0–1.2)
CO2: 25 mmol/L (ref 20–29)
Calcium: 9.3 mg/dL (ref 8.7–10.2)
Chloride: 99 mmol/L (ref 96–106)
Creatinine, Ser: 0.78 mg/dL (ref 0.57–1.00)
GFR calc Af Amer: 112 mL/min/{1.73_m2} (ref >59)
GFR calc non Af Amer: 97 mL/min/{1.73_m2} (ref >59)
Globulin, Total: 3.2 g/dL (ref 1.5–4.5)
Glucose: 101 mg/dL — ABNORMAL HIGH (ref 65–99)
Potassium: 4.4 mmol/L (ref 3.5–5.2)
Sodium: 138 mmol/L (ref 134–144)
Total Protein: 7.3 g/dL (ref 6.0–8.5)

## 2019-08-25 LAB — C-REACTIVE PROTEIN: CRP: 10 mg/L (ref 0–10)

## 2019-08-25 LAB — CBC WITH DIFFERENTIAL/PLATELET
Basophils Absolute: 0 10*3/uL (ref 0.0–0.2)
Basos: 1 %
EOS (ABSOLUTE): 0 10*3/uL (ref 0.0–0.4)
Eos: 0 %
Hematocrit: 39 % (ref 34.0–46.6)
Hemoglobin: 13.5 g/dL (ref 11.1–15.9)
Immature Grans (Abs): 0 10*3/uL (ref 0.0–0.1)
Immature Granulocytes: 0 %
Lymphocytes Absolute: 1.9 10*3/uL (ref 0.7–3.1)
Lymphs: 30 %
MCH: 31.2 pg (ref 26.6–33.0)
MCHC: 34.6 g/dL (ref 31.5–35.7)
MCV: 90 fL (ref 79–97)
Monocytes Absolute: 0.5 10*3/uL (ref 0.1–0.9)
Monocytes: 9 %
Neutrophils Absolute: 3.8 10*3/uL (ref 1.4–7.0)
Neutrophils: 60 %
Platelets: 328 10*3/uL (ref 150–450)
RBC: 4.33 x10E6/uL (ref 3.77–5.28)
RDW: 12.8 % (ref 11.7–15.4)
WBC: 6.3 10*3/uL (ref 3.4–10.8)

## 2019-08-25 LAB — TSH: TSH: 1.73 u[IU]/mL (ref 0.450–4.500)

## 2019-08-25 LAB — HIV ANTIBODY (ROUTINE TESTING W REFLEX): HIV Screen 4th Generation wRfx: NONREACTIVE

## 2019-08-25 LAB — LIPASE: Lipase: 33 U/L (ref 14–72)

## 2019-08-25 LAB — SEDIMENTATION RATE: Sed Rate: 35 mm/hr — ABNORMAL HIGH (ref 0–32)

## 2019-08-31 ENCOUNTER — Encounter: Payer: Self-pay | Admitting: Family Medicine

## 2019-09-02 ENCOUNTER — Ambulatory Visit
Admission: RE | Admit: 2019-09-02 | Discharge: 2019-09-02 | Disposition: A | Discharge status: Home / Self Care | Payer: Managed Care, Other (non HMO) | Source: Ambulatory Visit | Attending: Family Medicine | Admitting: Family Medicine

## 2019-09-02 DIAGNOSIS — R109 Unspecified abdominal pain: Secondary | ICD-10-CM

## 2019-09-02 DIAGNOSIS — K909 Intestinal malabsorption, unspecified: Secondary | ICD-10-CM

## 2019-09-02 DIAGNOSIS — R197 Diarrhea, unspecified: Secondary | ICD-10-CM

## 2019-09-02 LAB — STOOL CULTURE: E coli, Shiga toxin Assay: NEGATIVE

## 2019-09-02 LAB — OVA AND PARASITE EXAMINATION

## 2019-09-06 ENCOUNTER — Encounter: Payer: Self-pay | Admitting: Family Medicine

## 2019-09-07 ENCOUNTER — Other Ambulatory Visit: Payer: Self-pay | Admitting: *Deleted

## 2019-09-07 DIAGNOSIS — K8020 Calculus of gallbladder without cholecystitis without obstruction: Secondary | ICD-10-CM

## 2019-09-07 DIAGNOSIS — R109 Unspecified abdominal pain: Secondary | ICD-10-CM

## 2019-10-22 ENCOUNTER — Ambulatory Visit: Payer: Self-pay | Admitting: General Surgery

## 2019-10-22 NOTE — H&P (View-Only) (Signed)
Jennifer Gutierrez Documented: 10/22/2019 2:32 PM Location: [facility] Patient #: [id number] DOB: 01/18/1981 Single / Language: English / Race: Black or African American Female  History of Present Illness (Baeleigh Devincent M. Kemper Hochman MD; 10/22/2019 3:13 PM) The patient is a 38 year old female who presents with abdominal pain. She is referred by Dr. Jordan for evaluation for gallstones and abdominal pain and diarrhea. She states for the past several months she's been having right-sided pain. She points to her right lower quadrant. It generally occurs after eating. She will generally have to use the bathroom after eating as well. Her bowel movements have been more loose than solid side. She denies any melena or hematochezia. She describes the discomfort as a nagging pain as well as sometimes a crampy pain. It'll last for a few minutes. It'll radiate to her right mid back. She will have bloating and flatulence and burping. She denies any prior abdominal surgery. She denies any family history of IBS or inflammatory bowel disease like Crohn's or ulcerative colitis. She has never had an upper and lower endoscopy. She does not drink except on major holidays perhaps. She does not take any NSAIDs on a regular basis. She had an ultrasound which showed cholelithiasis as well as coarse and heterogeneous appearance of liver parenchyma of unknown significance. She had normal labs including a CBC and comprehensive metabolic panel. Stool studies were also negative as well.   Problem List/Past Medical (Doylene Splinter M. Falesha Schommer, MD; 10/22/2019 3:14 PM) SYMPTOMATIC CHOLELITHIASIS (K80.20) SEVERE OBESITY (E66.01)  Past Surgical History (Tanisha A. Brown, RMA; 10/22/2019 2:33 PM) Mammoplasty; Reduction Bilateral.  Diagnostic Studies History (Tanisha A. Brown, RMA; 10/22/2019 2:33 PM) Colonoscopy never Mammogram never Pap Smear 1-5 years ago  Allergies (Tanisha A. Brown, RMA; 10/22/2019 2:34 PM) No Known  Drug Allergies [10/22/2019]: Allergies Reconciled  Medication History (Tanisha A. Brown, RMA; 10/22/2019 2:34 PM) No Current Medications Medications Reconciled  Social History (Tanisha A. Brown, RMA; 10/22/2019 2:33 PM) Alcohol use Occasional alcohol use. Caffeine use Coffee. Illicit drug use Prefer to discuss with provider. Tobacco use Never smoker.  Family History (Tanisha A. Brown, RMA; 10/22/2019 2:33 PM) Arthritis Father, Mother. Hypertension Father, Mother.  Pregnancy / Birth History (Tanisha A. Brown, RMA; 10/22/2019 2:33 PM) Gravida 0 Para 0 Regular periods  Other Problems (Bayden Gil M. Abigael Mogle, MD; 10/22/2019 3:14 PM) No pertinent past medical history     Review of Systems (Tanisha A. Brown RMA; 10/22/2019 2:33 PM) General Not Present- Appetite Loss, Chills, Fatigue, Fever, Night Sweats, Weight Gain and Weight Loss. Skin Not Present- Change in Wart/Mole, Dryness, Hives, Jaundice, New Lesions, Non-Healing Wounds, Rash and Ulcer. HEENT Present- Seasonal Allergies and Wears glasses/contact lenses. Not Present- Earache, Hearing Loss, Hoarseness, Nose Bleed, Oral Ulcers, Ringing in the Ears, Sinus Pain, Sore Throat, Visual Disturbances and Yellow Eyes. Respiratory Present- Snoring. Not Present- Bloody sputum, Chronic Cough, Difficulty Breathing and Wheezing. Breast Not Present- Breast Mass, Breast Pain, Nipple Discharge and Skin Changes. Cardiovascular Not Present- Chest Pain, Difficulty Breathing Lying Down, Leg Cramps, Palpitations, Rapid Heart Rate, Shortness of Breath and Swelling of Extremities. Gastrointestinal Not Present- Abdominal Pain, Bloating, Bloody Stool, Change in Bowel Habits, Chronic diarrhea, Constipation, Difficulty Swallowing, Excessive gas, Gets full quickly at meals, Hemorrhoids, Indigestion, Nausea, Rectal Pain and Vomiting. Female Genitourinary Not Present- Frequency, Nocturia, Painful Urination, Pelvic Pain and Urgency. Musculoskeletal Not  Present- Back Pain, Joint Pain, Joint Stiffness, Muscle Pain, Muscle Weakness and Swelling of Extremities. Neurological Not Present- Decreased Memory, Fainting, Headaches, Numbness, Seizures, Tingling,   Tremor, Trouble walking and Weakness. Psychiatric Not Present- Anxiety, Bipolar, Change in Sleep Pattern, Depression, Fearful and Frequent crying. Endocrine Not Present- Cold Intolerance, Excessive Hunger, Hair Changes, Heat Intolerance, Hot flashes and New Diabetes. Hematology Not Present- Blood Thinners, Easy Bruising, Excessive bleeding, Gland problems, HIV and Persistent Infections.  Vitals (Tanisha A. Brown RMA; 10/22/2019 2:34 PM) 10/22/2019 2:33 PM Weight: 286.4 lb Height: 68in Body Surface Area: 2.38 m Body Mass Index: 43.55 kg/m  Temp.: 97.60F  Pulse: 108 (Regular)  BP: 136/86 (Sitting, Left Arm, Standard)        Physical Exam Randall Hiss M. Amilya Haver MD; 10/22/2019 3:11 PM)  General Mental Status-Alert. General Appearance-Consistent with stated age. Hydration-Well hydrated. Voice-Normal. Note: Severe obesity  Head and Neck Head-normocephalic, atraumatic with no lesions or palpable masses. Trachea-midline. Thyroid Gland Characteristics - normal size and consistency.  Eye Eyeball - Bilateral-Extraocular movements intact. Sclera/Conjunctiva - Bilateral-No scleral icterus.  ENMT Ears Pinna - Bilateral - no bony growth in lateral aspect of ear canal, no edema. Nose and Sinuses External Inspection of the Nose - symmetric, no deformities observed.  Chest and Lung Exam Chest and lung exam reveals -quiet, even and easy respiratory effort with no use of accessory muscles and on auscultation, normal breath sounds, no adventitious sounds and normal vocal resonance. Inspection Chest Wall - Normal. Back - normal.  Breast - Did not examine.  Cardiovascular Cardiovascular examination reveals -normal heart sounds, regular rate and rhythm with no  murmurs and normal pedal pulses bilaterally.  Abdomen Inspection Inspection of the abdomen reveals - No Hernias. Skin - Scar - no surgical scars. Palpation/Percussion Palpation and Percussion of the abdomen reveal - Soft, Non Tender, No Rebound tenderness, No Rigidity (guarding) and No hepatosplenomegaly. Auscultation Auscultation of the abdomen reveals - Bowel sounds normal.  Female Genitourinary Note: Chaperone present-Tanisha. No bulge in right groin. No evidence of right inguinal hernia  Peripheral Vascular Upper Extremity Palpation - Pulses bilaterally normal.  Neurologic Neurologic evaluation reveals -alert and oriented x 3 with no impairment of recent or remote memory. Mental Status-Normal.  Neuropsychiatric The patient's mood and affect are described as -normal. Judgment and Insight-insight is appropriate concerning matters relevant to self.  Musculoskeletal Normal Exam - Left-Upper Extremity Strength Normal and Lower Extremity Strength Normal. Normal Exam - Right-Upper Extremity Strength Normal and Lower Extremity Strength Normal.  Lymphatic Head & Neck  General Head & Neck Lymphatics: Bilateral - Description - Normal. Axillary - Did not examine. Femoral & Inguinal - Did not examine.    Assessment & Plan Randall Hiss M. Bridgitte Felicetti MD; 10/22/2019 3:14 PM)  SYMPTOMATIC CHOLELITHIASIS (K80.20) Impression: I believe most of the patient's symptoms are consistent with gallbladder disease. The only part of her history that is a little bit atypical for gallbladder disease is the fact that she states the pain is in the right lower abdomen.  We discussed gallbladder disease. The patient was given Neurosurgeon. We discussed non-operative and operative management. We discussed the signs & symptoms of acute cholecystitis  I discussed laparoscopic cholecystectomy with IOC in detail. The patient was given educational material as well as diagrams detailing the  procedure. We discussed the risks and benefits of a laparoscopic cholecystectomy including, but not limited to bleeding, infection, injury to surrounding structures such as the intestine or liver, bile leak, retained gallstones, need to convert to an open procedure, prolonged diarrhea, blood clots such as DVT, common bile duct injury, anesthesia risks, and possible need for additional procedures. We discussed the typical post-operative recovery course. I  explained that the likelihood of improvement of their symptoms is fair to good.  We did discuss pros and cons of evaluation by gastroenterology first to evaluate for IBS, etc. She declined and would like to proceed with surgery. We also discussed the finding of coarsened heterogeneous appearance of her liver. Although the ultrasound did not overtly discussed fatty liver she has no risk factors for underlying liver disease other than her obesity. She did have a comprehensive metabolic panel and CBC which are both normal. Since her labs were normal I do not think we need to do additional evaluation prior to cholecystectomy. We will repeat these labs prior to surgery as part of her preoperative labs to make sure that there is still normal  The patient has elected to proceed with surgery.  Current Plans Pt Education - Pamphlet Given - Laparoscopic Gallbladder Surgery: discussed with patient and provided information. You are being scheduled for surgery- Our schedulers will call you.  You should hear from our office's scheduling department within 5 working days about the location, date, and time of surgery. We try to make accommodations for patient's preferences in scheduling surgery, but sometimes the OR schedule or the surgeon's schedule prevents [place] from making those accommodations.  If you have not heard from our office [phone]) in 5 working days, call the office and ask for your surgeon's nurse.  If you have other questions about your diagnosis,  plan, or surgery, call the office and ask for your surgeon's nurse.   SEVERE OBESITY (E66.01)  Mary Sella. Andrey Campanile, MD, FACS General, Bariatric, & Minimally Invasive Surgery [facility], [place]

## 2019-10-22 NOTE — H&P (Signed)
[place] Callasbony S Rikard Documented: 10/22/2019 2:32 PM Location: [facility] Patient #: [id number] DOB: 07/14/1981 Single / Language: [place] / Race: Black or African American Female  History of Present Illness Jennifer Gutierrez(Vinod Mikesell M. Raylen Tangonan MD; 10/22/2019 3:13 PM) The patient is a 38 year old female who presents with abdominal pain. She is referred by Dr. [place] for evaluation for gallstones and abdominal pain and diarrhea. She states for the past several months she's been having right-sided pain. She points to her right lower quadrant. It generally occurs after eating. She will generally have to use the bathroom after eating as well. Her bowel movements have been more loose than solid side. She denies any melena or hematochezia. She describes the discomfort as a nagging pain as well as sometimes a crampy pain. It'll last for a few minutes. It'll radiate to her right mid back. She will have bloating and flatulence and burping. She denies any prior abdominal surgery. She denies any family history of IBS or inflammatory bowel disease like Crohn's or ulcerative colitis. She has never had an upper and lower endoscopy. She does not drink except on major holidays perhaps. She does not take any NSAIDs on a regular basis. She had an ultrasound which showed cholelithiasis as well as coarse and heterogeneous appearance of liver parenchyma of unknown significance. She had normal labs including a CBC and comprehensive metabolic panel. Stool studies were also negative as well.   Problem List/Past Medical Jennifer Gutierrez(Jennifer Swayne M. Andrey CampanileWilson, MD; 10/22/2019 3:14 PM) SYMPTOMATIC CHOLELITHIASIS (K80.20) SEVERE OBESITY (E66.01)  Past Surgical History (Tanisha A. Manson PasseyBrown, RMA; 10/22/2019 2:33 PM) Mammoplasty; Reduction Bilateral.  Diagnostic Studies History (Tanisha A. Manson PasseyBrown, RMA; 10/22/2019 2:33 PM) Colonoscopy never Mammogram never Pap Smear 1-5 years ago  Allergies (Tanisha A. Manson PasseyBrown, RMA; 10/22/2019 2:34 PM) No Known  Drug Allergies [10/22/2019]: Allergies Reconciled  Medication History (Tanisha A. Manson PasseyBrown, RMA; 10/22/2019 2:34 PM) No Current Medications Medications Reconciled  Social History (Tanisha A. Manson PasseyBrown, RMA; 10/22/2019 2:33 PM) Alcohol use Occasional alcohol use. Caffeine use Coffee. Illicit drug use Prefer to discuss with provider. Tobacco use Never smoker.  Family History (Tanisha A. Manson PasseyBrown, RMA; 10/22/2019 2:33 PM) Arthritis Father, Mother. Hypertension Father, Mother.  Pregnancy / Birth History (Tanisha A. Manson PasseyBrown, RMA; 10/22/2019 2:33 PM) Rhett BannisterGravida 0 Para 0 Regular periods  Other Problems Jennifer Gutierrez(Jennifer Keagle M. Andrey CampanileWilson, MD; 10/22/2019 3:14 PM) No pertinent past medical history     Review of Systems (Tanisha A. Brown RMA; 10/22/2019 2:33 PM) General Not Present- Appetite Loss, Chills, Fatigue, Fever, Night Sweats, Weight Gain and Weight Loss. Skin Not Present- Change in Wart/Mole, Dryness, Hives, Jaundice, New Lesions, Non-Healing Wounds, Rash and Ulcer. HEENT Present- Seasonal Allergies and Wears glasses/contact lenses. Not Present- Earache, Hearing Loss, Hoarseness, Nose Bleed, Oral Ulcers, Ringing in the Ears, Sinus Pain, Sore Throat, Visual Disturbances and Yellow Eyes. Respiratory Present- Snoring. Not Present- Bloody sputum, Chronic Cough, Difficulty Breathing and Wheezing. Breast Not Present- Breast Mass, Breast Pain, Nipple Discharge and Skin Changes. Cardiovascular Not Present- Chest Pain, Difficulty Breathing Lying Down, Leg Cramps, Palpitations, Rapid Heart Rate, Shortness of Breath and Swelling of Extremities. Gastrointestinal Not Present- Abdominal Pain, Bloating, Bloody Stool, Change in Bowel Habits, Chronic diarrhea, Constipation, Difficulty Swallowing, Excessive gas, Gets full quickly at meals, Hemorrhoids, Indigestion, Nausea, Rectal Pain and Vomiting. Female Genitourinary Not Present- Frequency, Nocturia, Painful Urination, Pelvic Pain and Urgency. Musculoskeletal Not  Present- Back Pain, Joint Pain, Joint Stiffness, Muscle Pain, Muscle Weakness and Swelling of Extremities. Neurological Not Present- Decreased Memory, Fainting, Headaches, Numbness, Seizures, Tingling,  Tremor, Trouble walking and Weakness. Psychiatric Not Present- Anxiety, Bipolar, Change in Sleep Pattern, Depression, Fearful and Frequent crying. Endocrine Not Present- Cold Intolerance, Excessive Hunger, Hair Changes, Heat Intolerance, Hot flashes and New Diabetes. Hematology Not Present- Blood Thinners, Easy Bruising, Excessive bleeding, Gland problems, HIV and Persistent Infections.  Vitals (Tanisha A. Brown RMA; 10/22/2019 2:34 PM) 10/22/2019 2:33 PM Weight: 286.4 lb Height: 68in Body Surface Area: 2.38 m Body Mass Index: 43.55 kg/m  Temp.: 97.60F  Pulse: 108 (Regular)  BP: 136/86 (Sitting, Left Arm, Standard)        Physical Exam Randall Hiss M. Melo Stauber MD; 10/22/2019 3:11 PM)  General Mental Status-Alert. General Appearance-Consistent with stated age. Hydration-Well hydrated. Voice-Normal. Note: Severe obesity  Head and Neck Head-normocephalic, atraumatic with no lesions or palpable masses. Trachea-midline. Thyroid Gland Characteristics - normal size and consistency.  Eye Eyeball - Bilateral-Extraocular movements intact. Sclera/Conjunctiva - Bilateral-No scleral icterus.  ENMT Ears Pinna - Bilateral - no bony growth in lateral aspect of ear canal, no edema. Nose and Sinuses External Inspection of the Nose - symmetric, no deformities observed.  Chest and Lung Exam Chest and lung exam reveals -quiet, even and easy respiratory effort with no use of accessory muscles and on auscultation, normal breath sounds, no adventitious sounds and normal vocal resonance. Inspection Chest Wall - Normal. Back - normal.  Breast - Did not examine.  Cardiovascular Cardiovascular examination reveals -normal heart sounds, regular rate and rhythm with no  murmurs and normal pedal pulses bilaterally.  Abdomen Inspection Inspection of the abdomen reveals - No Hernias. Skin - Scar - no surgical scars. Palpation/Percussion Palpation and Percussion of the abdomen reveal - Soft, Non Tender, No Rebound tenderness, No Rigidity (guarding) and No hepatosplenomegaly. Auscultation Auscultation of the abdomen reveals - Bowel sounds normal.  Female Genitourinary Note: Chaperone present-Tanisha. No bulge in right groin. No evidence of right inguinal hernia  Peripheral Vascular Upper Extremity Palpation - Pulses bilaterally normal.  Neurologic Neurologic evaluation reveals -alert and oriented x 3 with no impairment of recent or remote memory. Mental Status-Normal.  Neuropsychiatric The patient's mood and affect are described as -normal. Judgment and Insight-insight is appropriate concerning matters relevant to self.  Musculoskeletal Normal Exam - Left-Upper Extremity Strength Normal and Lower Extremity Strength Normal. Normal Exam - Right-Upper Extremity Strength Normal and Lower Extremity Strength Normal.  Lymphatic Head & Neck  General Head & Neck Lymphatics: Bilateral - Description - Normal. Axillary - Did not examine. Femoral & Inguinal - Did not examine.    Assessment & Plan Randall Hiss M. Latessa Tillis MD; 10/22/2019 3:14 PM)  SYMPTOMATIC CHOLELITHIASIS (K80.20) Impression: I believe most of the patient's symptoms are consistent with gallbladder disease. The only part of her history that is a little bit atypical for gallbladder disease is the fact that she states the pain is in the right lower abdomen.  We discussed gallbladder disease. The patient was given Neurosurgeon. We discussed non-operative and operative management. We discussed the signs & symptoms of acute cholecystitis  I discussed laparoscopic cholecystectomy with IOC in detail. The patient was given educational material as well as diagrams detailing the  procedure. We discussed the risks and benefits of a laparoscopic cholecystectomy including, but not limited to bleeding, infection, injury to surrounding structures such as the intestine or liver, bile leak, retained gallstones, need to convert to an open procedure, prolonged diarrhea, blood clots such as DVT, common bile duct injury, anesthesia risks, and possible need for additional procedures. We discussed the typical post-operative recovery course. I  explained that the likelihood of improvement of their symptoms is fair to good.  We did discuss pros and cons of evaluation by gastroenterology first to evaluate for IBS, etc. She declined and would like to proceed with surgery. We also discussed the finding of coarsened heterogeneous appearance of her liver. Although the ultrasound did not overtly discussed fatty liver she has no risk factors for underlying liver disease other than her obesity. She did have a comprehensive metabolic panel and CBC which are both normal. Since her labs were normal I do not think we need to do additional evaluation prior to cholecystectomy. We will repeat these labs prior to surgery as part of her preoperative labs to make sure that there is still normal  The patient has elected to proceed with surgery.  Current Plans Pt Education - Pamphlet Given - Laparoscopic Gallbladder Surgery: discussed with patient and provided information. You are being scheduled for surgery- Our schedulers will call you.  You should hear from our office's scheduling department within 5 working days about the location, date, and time of surgery. We try to make accommodations for patient's preferences in scheduling surgery, but sometimes the OR schedule or the surgeon's schedule prevents [place] from making those accommodations.  If you have not heard from our office [phone]) in 5 working days, call the office and ask for your surgeon's nurse.  If you have other questions about your diagnosis,  plan, or surgery, call the office and ask for your surgeon's nurse.   SEVERE OBESITY (E66.01)  Mary Sella. Andrey Campanile, MD, FACS General, Bariatric, & Minimally Invasive Surgery [facility], [place]

## 2019-11-12 NOTE — Patient Instructions (Addendum)
DUE TO COVID-19 ONLY ONE VISITOR IS ALLOWED TO COME WITH YOU AND STAY IN THE WAITING ROOM ONLY DURING PRE OP AND PROCEDURE DAY OF SURGERY. THE 1 VISITOR MAY VISIT WITH YOU AFTER SURGERY IN YOUR PRIVATE ROOM DURING VISITING HOURS ONLY!  YOU NEED TO HAVE A COVID 19 TEST ON___12-14 after this visit, THIS TEST MUST BE DONE BEFORE SURGERY, COME  [place], [place] , [id number].  ([place]) ONCE YOUR COVID TEST IS COMPLETED, PLEASE BEGIN THE QUARANTINE INSTRUCTIONS AS OUTLINED IN YOUR HANDOUT.                Jennifer Gutierrez   Your procedure is scheduled on: 12-17   Report to [facility]  Entrance   Report to admitting at 6:00AM     Call this number if you have problems the morning of surgery [phone]    Remember: Do not eat food or drink liquids :After Midnight. BRUSH YOUR TEETH MORNING OF SURGERY AND RINSE YOUR MOUTH OUT, NO CHEWING GUM CANDY OR MINTS.     Take these medicines the morning of surgery with A SIP OF WATER: none                                 You may not have any metal on your body including hair pins and              piercings  Do not wear jewelry, make-up, lotions, powders or perfumes, deodorant             Do not wear nail polish on your fingernails.  Do not shave  48 hours prior to surgery.              Do not bring valuables to the hospital. [place].  Contacts, dentures or bridgework may not be worn into surgery.      Patients discharged the day of surgery will not be allowed to drive home. IF YOU ARE HAVING SURGERY AND GOING HOME THE SAME DAY, YOU MUST HAVE AN ADULT TO DRIVE YOU HOME AND BE WITH YOU FOR 24 HOURS. YOU MAY GO HOME BY TAXI OR UBER OR ORTHERWISE, BUT AN ADULT MUST ACCOMPANY YOU HOME AND STAY WITH YOU FOR 24 HOURS.  Name and phone number of your driver:  Special Instructions: N/A              Please read over the following fact sheets you were  given: _____________________________________________________________________             [facility] - Preparing for Surgery Before surgery, you can play an important role.  Because skin is not sterile, your skin needs to be as free of germs as possible.  You can reduce the number of germs on your skin by washing with CHG (chlorahexidine gluconate) soap before surgery.  CHG is an antiseptic cleaner which kills germs and bonds with the skin to continue killing germs even after washing. Please DO NOT use if you have an allergy to CHG or antibacterial soaps.  If your skin becomes reddened/irritated stop using the CHG and inform your nurse when you arrive at Short Stay. Do not shave (including legs and underarms) for at least 48 hours prior to the first CHG shower.  You may shave your face/neck.  Please follow these instructions carefully:  1.  Shower with CHG Soap the night before surgery and the  morning of Surgery.  2.  If you choose to wash your hair, wash your hair first as usual with your  normal  shampoo.  3.  After you shampoo, rinse your hair and body thoroughly to remove the  shampoo.                           4.  Use CHG as you would any other liquid soap.  You can apply chg directly  to the skin and wash                       Gently with a scrungie or clean washcloth.  5.  Apply the CHG Soap to your body ONLY FROM THE NECK DOWN.   Do not use on face/ open                           Wound or open sores. Avoid contact with eyes, ears mouth and genitals (private parts).                       Wash face,  Genitals (private parts) with your normal soap.             6.  Wash thoroughly, paying special attention to the area where your surgery  will be performed.  7.  Thoroughly rinse your body with warm water from the neck down.  8.  DO NOT shower/wash with your normal soap after using and rinsing off  the CHG Soap.                9.  Pat yourself dry with a clean towel.            10.  Wear  clean pajamas.            11.  Place clean sheets on your bed the night of your first shower and do not  sleep with pets. Day of Surgery : Do not apply any lotions/deodorants the morning of surgery.  Please wear clean clothes to the hospital/surgery center.  FAILURE TO FOLLOW THESE INSTRUCTIONS MAY RESULT IN THE CANCELLATION OF YOUR SURGERY PATIENT SIGNATURE_________________________________  NURSE SIGNATURE__________________________________  ________________________________________________________________________

## 2019-11-16 ENCOUNTER — Encounter (HOSPITAL_COMMUNITY)
Admission: RE | Admit: 2019-11-16 | Discharge: 2019-11-16 | Disposition: A | Discharge status: Home / Self Care | Payer: Managed Care, Other (non HMO) | Source: Ambulatory Visit | Attending: General Surgery | Admitting: General Surgery

## 2019-11-16 ENCOUNTER — Other Ambulatory Visit (HOSPITAL_COMMUNITY)
Admission: RE | Admit: 2019-11-16 | Discharge: 2019-11-16 | Disposition: A | Discharge status: Home / Self Care | Payer: Managed Care, Other (non HMO) | Source: Ambulatory Visit | Attending: General Surgery | Admitting: General Surgery

## 2019-11-16 ENCOUNTER — Encounter (HOSPITAL_COMMUNITY): Payer: Self-pay

## 2019-11-16 ENCOUNTER — Other Ambulatory Visit: Payer: Self-pay

## 2019-11-16 DIAGNOSIS — K8020 Calculus of gallbladder without cholecystitis without obstruction: Secondary | ICD-10-CM | POA: Insufficient documentation

## 2019-11-16 DIAGNOSIS — Z20828 Contact with and (suspected) exposure to other viral communicable diseases: Secondary | ICD-10-CM | POA: Insufficient documentation

## 2019-11-16 DIAGNOSIS — Z01812 Encounter for preprocedural laboratory examination: Secondary | ICD-10-CM | POA: Diagnosis not present

## 2019-11-16 LAB — CBC WITH DIFFERENTIAL/PLATELET
Abs Immature Granulocytes: 0.02 10*3/uL (ref 0.00–0.07)
Basophils Absolute: 0 10*3/uL (ref 0.0–0.1)
Basophils Relative: 0 %
Eosinophils Absolute: 0 10*3/uL (ref 0.0–0.5)
Eosinophils Relative: 0 %
HCT: 38.9 % (ref 36.0–46.0)
Hemoglobin: 12.7 g/dL (ref 12.0–15.0)
Immature Granulocytes: 0 %
Lymphocytes Relative: 39 %
Lymphs Abs: 2.1 10*3/uL (ref 0.7–4.0)
MCH: 31.1 pg (ref 26.0–34.0)
MCHC: 32.6 g/dL (ref 30.0–36.0)
MCV: 95.1 fL (ref 80.0–100.0)
Monocytes Absolute: 0.4 10*3/uL (ref 0.1–1.0)
Monocytes Relative: 7 %
Neutro Abs: 2.8 10*3/uL (ref 1.7–7.7)
Neutrophils Relative %: 54 %
Platelets: 362 10*3/uL (ref 150–400)
RBC: 4.09 MIL/uL (ref 3.87–5.11)
RDW: 12.9 % (ref 11.5–15.5)
WBC: 5.3 10*3/uL (ref 4.0–10.5)
nRBC: 0 % (ref 0.0–0.2)

## 2019-11-16 LAB — COMPREHENSIVE METABOLIC PANEL
ALT: 15 U/L (ref 0–44)
AST: 17 U/L (ref 15–41)
Albumin: 3.8 g/dL (ref 3.5–5.0)
Alkaline Phosphatase: 54 U/L (ref 38–126)
Anion gap: 8 (ref 5–15)
BUN: 11 mg/dL (ref 6–20)
CO2: 27 mmol/L (ref 22–32)
Calcium: 8.9 mg/dL (ref 8.9–10.3)
Chloride: 105 mmol/L (ref 98–111)
Creatinine, Ser: 0.75 mg/dL (ref 0.44–1.00)
GFR calc Af Amer: >60 mL/min (ref >60)
GFR calc non Af Amer: >60 mL/min (ref >60)
Glucose, Bld: 93 mg/dL (ref 70–99)
Potassium: 3.8 mmol/L (ref 3.5–5.1)
Sodium: 140 mmol/L (ref 135–145)
Total Bilirubin: 0.4 mg/dL (ref 0.3–1.2)
Total Protein: 7.4 g/dL (ref 6.5–8.1)

## 2019-11-16 LAB — PROTIME-INR
INR: 1 (ref 0.8–1.2)
Prothrombin Time: 13.2 seconds (ref 11.4–15.2)

## 2019-11-16 NOTE — Progress Notes (Signed)
PCP - Betty Martinique Cardiologist -   Chest x-ray -  EKG -  Stress Test -  ECHO -  Cardiac Cath -   Sleep Study -  CPAP -   Fasting Blood Sugar -  Checks Blood Sugar _____ times a day  Blood Thinner Instructions: Aspirin Instructions: Last Dose:  Anesthesia review: Elevated Diastolic BP,recheck towards the end of the appointment,went down a little.Pt. verbalized feeling well,no chest pain,no shortness of breast,no discomfort.  Patient denies shortness of breath, fever, cough and chest pain at PAT appointment   Patient verbalized understanding of instructions that were given to them at the PAT appointment. Patient was also instructed that they will need to review over the PAT instructions again at home before surgery.

## 2019-11-17 LAB — NOVEL CORONAVIRUS, NAA (HOSP ORDER, SEND-OUT TO REF LAB; TAT 18-24 HRS): SARS-CoV-2, NAA: NOT DETECTED

## 2019-11-19 ENCOUNTER — Ambulatory Visit (HOSPITAL_COMMUNITY): Payer: Managed Care, Other (non HMO)

## 2019-11-19 ENCOUNTER — Ambulatory Visit (HOSPITAL_COMMUNITY): Payer: Managed Care, Other (non HMO) | Admitting: Physician Assistant

## 2019-11-19 ENCOUNTER — Encounter (HOSPITAL_COMMUNITY): Payer: Self-pay | Admitting: General Surgery

## 2019-11-19 ENCOUNTER — Ambulatory Visit (HOSPITAL_COMMUNITY)
Admission: RE | Admit: 2019-11-19 | Discharge: 2019-11-19 | Disposition: A | Discharge status: Home / Self Care | Payer: Managed Care, Other (non HMO) | Attending: General Surgery | Admitting: General Surgery

## 2019-11-19 ENCOUNTER — Encounter (HOSPITAL_COMMUNITY)
Admission: RE | Disposition: A | Discharge status: Home / Self Care | Payer: Self-pay | Source: Home / Self Care | Attending: General Surgery

## 2019-11-19 DIAGNOSIS — K8010 Calculus of gallbladder with chronic cholecystitis without obstruction: Secondary | ICD-10-CM | POA: Diagnosis not present

## 2019-11-19 DIAGNOSIS — K8020 Calculus of gallbladder without cholecystitis without obstruction: Secondary | ICD-10-CM

## 2019-11-19 HISTORY — PX: CHOLECYSTECTOMY: SHX55

## 2019-11-19 LAB — PREGNANCY, URINE: Preg Test, Ur: NEGATIVE

## 2019-11-19 SURGERY — LAPAROSCOPIC CHOLECYSTECTOMY
Anesthesia: General | Site: Abdomen

## 2019-11-19 MED ORDER — ACETAMINOPHEN 500 MG PO TABS: 1000.0000 mg | ORAL_TABLET | ORAL | Status: AC

## 2019-11-19 MED ORDER — ACETAMINOPHEN 10 MG/ML IV SOLN: 1000.0000 mg | Freq: Once | INTRAVENOUS | Status: DC | PRN

## 2019-11-19 MED ORDER — IOHEXOL 300 MG/ML  SOLN
INTRAMUSCULAR | Status: DC | PRN
  Administered 2019-11-19: 09:00:00 4 mL

## 2019-11-19 MED ORDER — PROPOFOL 10 MG/ML IV BOLUS
INTRAVENOUS | Status: DC | PRN
  Administered 2019-11-19: 150 mg via INTRAVENOUS

## 2019-11-19 MED ORDER — FENTANYL CITRATE (PF) 100 MCG/2ML IJ SOLN
25.0000 ug | INTRAMUSCULAR | Status: DC | PRN
  Administered 2019-11-19 (×3): 50 ug via INTRAVENOUS

## 2019-11-19 MED ORDER — GABAPENTIN 300 MG PO CAPS
ORAL_CAPSULE | ORAL | Status: AC
  Administered 2019-11-19: 300 mg via ORAL
  Filled 2019-11-19: qty 1

## 2019-11-19 MED ORDER — 0.9 % SODIUM CHLORIDE (POUR BTL) OPTIME
TOPICAL | Status: DC | PRN
  Administered 2019-11-19: 09:00:00 1000 mL

## 2019-11-19 MED ORDER — BUPIVACAINE HCL (PF) 0.25 % IJ SOLN
INTRAMUSCULAR | Status: DC | PRN
  Administered 2019-11-19: 35 mL

## 2019-11-19 MED ORDER — MIDAZOLAM HCL 2 MG/2ML IJ SOLN
INTRAMUSCULAR | Status: AC
  Filled 2019-11-19: qty 2

## 2019-11-19 MED ORDER — ACETAMINOPHEN 500 MG PO TABS: 1000.0000 mg | ORAL_TABLET | Freq: Once | ORAL | Status: DC | PRN

## 2019-11-19 MED ORDER — BUPIVACAINE HCL 0.25 % IJ SOLN
INTRAMUSCULAR | Status: AC
  Filled 2019-11-19: qty 1

## 2019-11-19 MED ORDER — MIDAZOLAM HCL 5 MG/5ML IJ SOLN
INTRAMUSCULAR | Status: DC | PRN
  Administered 2019-11-19: 2 mg via INTRAVENOUS

## 2019-11-19 MED ORDER — ACETAMINOPHEN 160 MG/5ML PO SOLN: 1000.0000 mg | Freq: Once | ORAL | Status: DC | PRN

## 2019-11-19 MED ORDER — KETOROLAC TROMETHAMINE 30 MG/ML IJ SOLN
INTRAMUSCULAR | Status: DC | PRN
  Administered 2019-11-19: 30 mg via INTRAVENOUS

## 2019-11-19 MED ORDER — ONDANSETRON HCL 4 MG/2ML IJ SOLN
INTRAMUSCULAR | Status: AC
  Filled 2019-11-19: qty 2

## 2019-11-19 MED ORDER — ONDANSETRON HCL 4 MG/2ML IJ SOLN
4.0000 mg | Freq: Once | INTRAMUSCULAR | Status: AC
  Administered 2019-11-19: 4 mg via INTRAVENOUS

## 2019-11-19 MED ORDER — LIDOCAINE 2% (20 MG/ML) 5 ML SYRINGE
INTRAMUSCULAR | Status: DC | PRN
  Administered 2019-11-19: 60 mg via INTRAVENOUS

## 2019-11-19 MED ORDER — OXYCODONE HCL 5 MG PO TABS: 5.0000 mg | ORAL_TABLET | Freq: Four times a day (QID) | ORAL | 0 refills | Status: DC | PRN

## 2019-11-19 MED ORDER — DEXAMETHASONE SODIUM PHOSPHATE 10 MG/ML IJ SOLN
INTRAMUSCULAR | Status: DC | PRN
  Administered 2019-11-19: 10 mg via INTRAVENOUS

## 2019-11-19 MED ORDER — DEXAMETHASONE SODIUM PHOSPHATE 10 MG/ML IJ SOLN
INTRAMUSCULAR | Status: AC
  Filled 2019-11-19: qty 1

## 2019-11-19 MED ORDER — CHLORHEXIDINE GLUCONATE CLOTH 2 % EX PADS: 6.0000 | MEDICATED_PAD | Freq: Once | CUTANEOUS | Status: DC

## 2019-11-19 MED ORDER — OXYCODONE HCL 5 MG PO TABS: 5.0000 mg | ORAL_TABLET | Freq: Once | ORAL | Status: DC | PRN

## 2019-11-19 MED ORDER — GABAPENTIN 300 MG PO CAPS: 300.0000 mg | ORAL_CAPSULE | ORAL | Status: AC

## 2019-11-19 MED ORDER — ACETAMINOPHEN 500 MG PO TABS
ORAL_TABLET | ORAL | Status: AC
  Administered 2019-11-19: 1000 mg via ORAL
  Filled 2019-11-19: qty 2

## 2019-11-19 MED ORDER — FENTANYL CITRATE (PF) 100 MCG/2ML IJ SOLN
INTRAMUSCULAR | Status: AC
  Filled 2019-11-19: qty 2

## 2019-11-19 MED ORDER — FENTANYL CITRATE (PF) 100 MCG/2ML IJ SOLN
INTRAMUSCULAR | Status: DC | PRN
  Administered 2019-11-19 (×2): 50 ug via INTRAVENOUS
  Administered 2019-11-19: 100 ug via INTRAVENOUS

## 2019-11-19 MED ORDER — LIDOCAINE 20MG/ML (2%) 15 ML SYRINGE OPTIME
INTRAMUSCULAR | Status: DC | PRN
  Administered 2019-11-19: 1.5 mg/kg/h via INTRAVENOUS

## 2019-11-19 MED ORDER — ROCURONIUM BROMIDE 10 MG/ML (PF) SYRINGE
PREFILLED_SYRINGE | INTRAVENOUS | Status: AC
  Filled 2019-11-19: qty 10

## 2019-11-19 MED ORDER — KETAMINE HCL 10 MG/ML IJ SOLN
INTRAMUSCULAR | Status: AC
  Filled 2019-11-19: qty 1

## 2019-11-19 MED ORDER — FENTANYL CITRATE (PF) 250 MCG/5ML IJ SOLN
INTRAMUSCULAR | Status: AC
  Filled 2019-11-19: qty 5

## 2019-11-19 MED ORDER — KETAMINE HCL 10 MG/ML IJ SOLN
INTRAMUSCULAR | Status: DC | PRN
  Administered 2019-11-19: 20 mg via INTRAVENOUS
  Administered 2019-11-19: 30 mg via INTRAVENOUS

## 2019-11-19 MED ORDER — ONDANSETRON HCL 4 MG/2ML IJ SOLN
INTRAMUSCULAR | Status: DC | PRN
  Administered 2019-11-19: 4 mg via INTRAVENOUS

## 2019-11-19 MED ORDER — SUGAMMADEX SODIUM 200 MG/2ML IV SOLN
INTRAVENOUS | Status: DC | PRN
  Administered 2019-11-19: 200 mg via INTRAVENOUS

## 2019-11-19 MED ORDER — LABETALOL HCL 5 MG/ML IV SOLN
INTRAVENOUS | Status: DC | PRN
  Administered 2019-11-19 (×2): 10 mg via INTRAVENOUS

## 2019-11-19 MED ORDER — SODIUM CHLORIDE 0.9 % IV SOLN
2.0000 g | INTRAVENOUS | Status: AC
  Administered 2019-11-19: 2 g via INTRAVENOUS

## 2019-11-19 MED ORDER — LABETALOL HCL 5 MG/ML IV SOLN
INTRAVENOUS | Status: AC
  Filled 2019-11-19: qty 4

## 2019-11-19 MED ORDER — OXYCODONE HCL 5 MG/5ML PO SOLN: 5.0000 mg | Freq: Once | ORAL | Status: DC | PRN

## 2019-11-19 MED ORDER — LIDOCAINE 2% (20 MG/ML) 5 ML SYRINGE
INTRAMUSCULAR | Status: AC
  Filled 2019-11-19: qty 5

## 2019-11-19 MED ORDER — PROPOFOL 10 MG/ML IV BOLUS
INTRAVENOUS | Status: AC
  Filled 2019-11-19: qty 20

## 2019-11-19 MED ORDER — LACTATED RINGERS IV SOLN: INTRAVENOUS | Status: DC

## 2019-11-19 MED ORDER — ROCURONIUM BROMIDE 10 MG/ML (PF) SYRINGE
PREFILLED_SYRINGE | INTRAVENOUS | Status: DC | PRN
  Administered 2019-11-19: 60 mg via INTRAVENOUS
  Administered 2019-11-19: 10 mg via INTRAVENOUS

## 2019-11-19 MED ORDER — SODIUM CHLORIDE 0.9 % IV SOLN
INTRAVENOUS | Status: AC
  Filled 2019-11-19: qty 2

## 2019-11-19 MED ORDER — LACTATED RINGERS IR SOLN
Status: DC | PRN
  Administered 2019-11-19: 1000 mL

## 2019-11-19 MED ORDER — DEXAMETHASONE SODIUM PHOSPHATE 4 MG/ML IJ SOLN: 4.0000 mg | INTRAMUSCULAR | Status: DC

## 2019-11-19 SURGICAL SUPPLY — 49 items
APPLICATOR ARISTA FLEXITIP XL (MISCELLANEOUS) IMPLANT
APPLIER CLIP 5 13 M/L LIGAMAX5 (MISCELLANEOUS) ×2
APPLIER CLIP ROT 10 11.4 M/L (STAPLE)
BENZOIN TINCTURE PRP APPL 2/3 (GAUZE/BANDAGES/DRESSINGS) ×1 IMPLANT
BNDG ADH 1X3 SHEER STRL LF (GAUZE/BANDAGES/DRESSINGS) ×5 IMPLANT
CABLE HIGH FREQUENCY MONO STRZ (ELECTRODE) ×2 IMPLANT
CHLORAPREP W/TINT 26 (MISCELLANEOUS) ×2 IMPLANT
CLIP APPLIE 5 13 M/L LIGAMAX5 (MISCELLANEOUS) IMPLANT
CLIP APPLIE ROT 10 11.4 M/L (STAPLE) IMPLANT
CLIP VESOLOCK MED LG 6/CT (CLIP) IMPLANT
COVER MAYO STAND STRL (DRAPES) IMPLANT
COVER SURGICAL LIGHT HANDLE (MISCELLANEOUS) ×2 IMPLANT
COVER WAND RF STERILE (DRAPES) IMPLANT
DECANTER SPIKE VIAL GLASS SM (MISCELLANEOUS) ×2 IMPLANT
DERMABOND ADVANCED (GAUZE/BANDAGES/DRESSINGS)
DERMABOND ADVANCED .7 DNX12 (GAUZE/BANDAGES/DRESSINGS) IMPLANT
DRAPE C-ARM 42X120 X-RAY (DRAPES) IMPLANT
DRSG TEGADERM 2-3/8X2-3/4 SM (GAUZE/BANDAGES/DRESSINGS) IMPLANT
ELECT REM PT RETURN 15FT ADLT (MISCELLANEOUS) ×2 IMPLANT
GAUZE SPONGE 2X2 8PLY STRL LF (GAUZE/BANDAGES/DRESSINGS) IMPLANT
GLOVE BIO SURGEON STRL SZ7.5 (GLOVE) ×2 IMPLANT
GLOVE INDICATOR 8.0 STRL GRN (GLOVE) ×2 IMPLANT
GOWN STRL REUS W/TWL XL LVL3 (GOWN DISPOSABLE) ×6 IMPLANT
GRASPER SUT TROCAR 14GX15 (MISCELLANEOUS) ×1 IMPLANT
HEMOSTAT ARISTA ABSORB 3G PWDR (HEMOSTASIS) IMPLANT
HEMOSTAT SNOW SURGICEL 2X4 (HEMOSTASIS) IMPLANT
KIT BASIN OR (CUSTOM PROCEDURE TRAY) ×2 IMPLANT
KIT TURNOVER KIT A (KITS) IMPLANT
L-HOOK LAP DISP 36CM (ELECTROSURGICAL)
LHOOK LAP DISP 36CM (ELECTROSURGICAL) IMPLANT
POUCH RETRIEVAL ECOSAC 10 (ENDOMECHANICALS) ×1 IMPLANT
POUCH RETRIEVAL ECOSAC 10MM (ENDOMECHANICALS) ×1
SCISSORS LAP 5X35 DISP (ENDOMECHANICALS) ×2 IMPLANT
SET CHOLANGIOGRAPH MIX (MISCELLANEOUS) IMPLANT
SET IRRIG TUBING LAPAROSCOPIC (IRRIGATION / IRRIGATOR) ×2 IMPLANT
SET TUBE SMOKE EVAC HIGH FLOW (TUBING) ×2 IMPLANT
SLEEVE XCEL OPT CAN 5 100 (ENDOMECHANICALS) ×4 IMPLANT
SPONGE GAUZE 2X2 STER 10/PKG (GAUZE/BANDAGES/DRESSINGS)
STRIP CLOSURE SKIN 1/2X4 (GAUZE/BANDAGES/DRESSINGS) ×1 IMPLANT
SUT MNCRL AB 4-0 PS2 18 (SUTURE) ×2 IMPLANT
SUT VIC AB 0 UR5 27 (SUTURE) IMPLANT
SUT VICRYL 0 TIES 12 18 (SUTURE) ×1 IMPLANT
SUT VICRYL 0 UR6 27IN ABS (SUTURE) IMPLANT
TOWEL OR 17X26 10 PK STRL BLUE (TOWEL DISPOSABLE) ×2 IMPLANT
TOWEL OR NON WOVEN STRL DISP B (DISPOSABLE) ×2 IMPLANT
TRAY LAPAROSCOPIC (CUSTOM PROCEDURE TRAY) ×2 IMPLANT
TROCAR BLADELESS OPT 5 100 (ENDOMECHANICALS) ×2 IMPLANT
TROCAR XCEL BLUNT TIP 100MML (ENDOMECHANICALS) IMPLANT
TROCAR XCEL NON-BLD 11X100MML (ENDOMECHANICALS) IMPLANT

## 2019-11-19 NOTE — Interval H&P Note (Signed)
History and Physical Interval Note:  11/19/2019 7:13 AM  Jennifer Gutierrez  has presented today for surgery, with the diagnosis of SYMPTOMATIC CHOLELITHIASIS.  The various methods of treatment have been discussed with the patient and family. After consideration of risks, benefits and other options for treatment, the patient has consented to  Procedure(s): LAPAROSCOPIC CHOLECYSTECTOMY WITH POSSIBLE INTRAOPERATIVE CHOLANGIOGRAM (N/A) as a surgical intervention.  The patient's history has been reviewed, patient examined, no change in status, stable for surgery.  I have reviewed the patient's chart and labs.  Questions were answered to the patient's satisfaction.     Greer Pickerel

## 2019-11-19 NOTE — Transfer of Care (Signed)
Immediate Anesthesia Transfer of Care Note  Patient: Jennifer Gutierrez  Procedure(s) Performed: Procedure(s): LAPAROSCOPIC CHOLECYSTECTOMY WITH INTRAOPERATIVE CHOLANGIOGRAM (N/A)  Patient Location: PACU  Anesthesia Type:General  Level of Consciousness: Alert, Awake, Oriented  Airway & Oxygen Therapy: Patient Spontanous Breathing  Post-op Assessment: Report given to RN  Post vital signs: Reviewed and stable  Last Vitals:  Vitals:   11/19/19 0500 11/19/19 0909  BP: (!) 148/106   Pulse: 89   Resp: 16   Temp: 36.7 C (!) (P) 36 C  SpO2: 734%     Complications: No apparent anesthesia complications

## 2019-11-19 NOTE — Anesthesia Procedure Notes (Signed)
Procedure Name: Intubation Date/Time: 11/19/2019 7:26 AM Performed by: Gerald Leitz, CRNA Pre-anesthesia Checklist: Patient identified, Patient being monitored, Timeout performed, Emergency Drugs available and Suction available Patient Re-evaluated:Patient Re-evaluated prior to induction Oxygen Delivery Method: Circle system utilized Preoxygenation: Pre-oxygenation with 100% oxygen Induction Type: IV induction Ventilation: Mask ventilation without difficulty Laryngoscope Size: Mac and 3 Grade View: Grade I Tube type: Oral Tube size: 7.0 mm Number of attempts: 1 Placement Confirmation: ETT inserted through vocal cords under direct vision,  positive ETCO2 and breath sounds checked- equal and bilateral Secured at: 21 cm Tube secured with: Tape Dental Injury: Teeth and Oropharynx as per pre-operative assessment

## 2019-11-19 NOTE — Anesthesia Preprocedure Evaluation (Addendum)
Anesthesia Evaluation  Patient identified by MRN, date of birth, ID band Patient awake    Reviewed: Allergy & Precautions, NPO status , Patient's Chart, lab work & pertinent test results  History of Anesthesia Complications Negative for: history of anesthetic complications  Airway Mallampati: III  TM Distance: >3 FB Neck ROM: Full    Dental  (+) Teeth Intact, Dental Advisory Given   Pulmonary neg recent URI,    breath sounds clear to auscultation       Cardiovascular negative cardio ROS   Rhythm:Regular     Neuro/Psych negative neurological ROS  negative psych ROS   GI/Hepatic Neg liver ROS, SYMPTOMATIC CHOLELITHIASIS   Endo/Other  Morbid obesity  Renal/GU negative Renal ROS     Musculoskeletal  (+) Arthritis ,   Abdominal   Peds  Hematology negative hematology ROS (+)   Anesthesia Other Findings   Reproductive/Obstetrics                            Anesthesia Physical Anesthesia Plan  ASA: II  Anesthesia Plan: General   Post-op Pain Management:    Induction: Intravenous  PONV Risk Score and Plan: 3 and Ondansetron and Dexamethasone  Airway Management Planned: Oral ETT  Additional Equipment: None  Intra-op Plan:   Post-operative Plan: Extubation in OR  Informed Consent: I have reviewed the patients History and Physical, chart, labs and discussed the procedure including the risks, benefits and alternatives for the proposed anesthesia with the patient or authorized representative who has indicated his/her understanding and acceptance.     Dental advisory given  Plan Discussed with: CRNA and Surgeon  Anesthesia Plan Comments:         Anesthesia Quick Evaluation

## 2019-11-19 NOTE — Anesthesia Postprocedure Evaluation (Signed)
Anesthesia Post Note  Patient: Jennifer Gutierrez  Procedure(s) Performed: LAPAROSCOPIC CHOLECYSTECTOMY WITH INTRAOPERATIVE CHOLANGIOGRAM (N/A Abdomen)     Patient location during evaluation: PACU Anesthesia Type: General Level of consciousness: awake and alert Pain management: pain level controlled Vital Signs Assessment: post-procedure vital signs reviewed and stable Respiratory status: spontaneous breathing, nonlabored ventilation, respiratory function stable and patient connected to nasal cannula oxygen Cardiovascular status: blood pressure returned to baseline and stable Postop Assessment: no apparent nausea or vomiting Anesthetic complications: no    Last Vitals:  Vitals:   11/19/19 0945 11/19/19 1000  BP: (!) 159/87 (!) 143/91  Pulse: 68 72  Resp: 11 12  Temp:  (!) 36.1 C  SpO2:      Last Pain:  Vitals:   11/19/19 1000  TempSrc:   PainSc: 5                  Tylea Hise

## 2019-11-19 NOTE — Op Note (Signed)
Jennifer Gutierrez [id number] 06/15/1981 11/19/2019  Laparoscopic Cholecystectomy with IOC Procedure Note  Indications: This patient presents with symptomatic gallbladder disease and will undergo laparoscopic cholecystectomy.  Pre-operative Diagnosis: symptomatic cholelithiasis  Post-operative Diagnosis: Same  Surgeon: Greer Pickerel MD FACS  Anesthesia: General endotracheal anesthesia  Procedure Details  The patient was seen again in the Holding Room. The risks, benefits, complications, treatment options, and expected outcomes were discussed with the patient. The possibilities of reaction to medication, pulmonary aspiration, perforation of viscus, bleeding, recurrent infection, finding a normal gallbladder, the need for additional procedures, failure to diagnose a condition, the possible need to convert to an open procedure, and creating a complication requiring transfusion or operation were discussed with the patient. The likelihood of improving the patient's symptoms with return to their baseline status is good.  The patient and/or family concurred with the proposed plan, giving informed consent. The site of surgery properly noted. The patient was taken to Operating Room, identified as Jennifer Gutierrez and the procedure verified as Laparoscopic Cholecystectomy with Intraoperative Cholangiogram. A Time Out was held and the above information confirmed. Antibiotic prophylaxis was administered.   Prior to the induction of general anesthesia, antibiotic prophylaxis was administered. General endotracheal anesthesia was then administered and tolerated well. After the induction, the abdomen was prepped with Chloraprep and draped in the sterile fashion. The patient was positioned in the supine position.  Local anesthetic agent was injected into the skin near the umbilicus and an incision made. We dissected down to the abdominal fascia with blunt dissection with some difficulty due to her body habitus.  So  therefore I decided to gain access to the abdomen using the Optiview technique.  A small incision was made below the left subcostal margin a little bit more medial in the midclavicular line.  Then using a 0 degree 5 mm laparoscope through a 5 mm trocar I advanced it through all layers of the abdominal wall and entered the abdominal cavity.  Pneumoperitoneum was smoothly established up to a patient pressure of 15 mmHg without any change in patient vital signs.  There is no evidence of injury to surrounding structures.  The supraumbilical location was identified and I had not entered the fascia when I attempted to gain access in the supraumbilical position.  I then placed a 11 mm trocar under direct visualization in the supraumbilical position..  Two 5-mm ports were placed in the right upper quadrant. All skin incisions were infiltrated with a local anesthetic agent before making the incision and placing the trocars.   We positioned the patient in reverse Trendelenburg, tilted slightly to the patient's left.  The gallbladder was identified, the fundus grasped and retracted cephalad. Adhesions were lysed bluntly and with the electrocautery where indicated, taking care not to injure any adjacent organs or viscus. The infundibulum was grasped and retracted laterally, exposing the peritoneum overlying the triangle of Calot. This was then divided and exposed in a blunt fashion. A critical view of the cystic duct and cystic artery was obtained.  The cystic duct was clearly identified and bluntly dissected circumferentially. The cystic duct was ligated with a clip distally.   An incision was made in the cystic duct and the [facility] cholangiogram catheter introduced. The catheter was secured using a clip. A cholangiogram was then obtained which showed good visualization of the distal and proximal biliary tree with no sign of filling defects or obstruction.  Contrast flowed easily into the duodenum. The catheter was then  removed.  The cystic duct was then ligated with clips and divided. The cystic artery which had been identified & dissected free was ligated with clips and divided as well.   The gallbladder was dissected from the liver bed in retrograde fashion with the electrocautery. The gallbladder was removed and placed in an Ecco sac. The liver bed was irrigated and inspected. Hemostasis was achieved with the electrocautery. Copious irrigation was utilized and was repeatedly aspirated until clear.  The gallbladder and Ecco sac were then removed through the umbilical port site.  The umbilical fascia was then closed with 2 interrupted 0 Vicryl sutures using the PMI suture passer with laparoscopic guidance.  There was no air leak at the umbilical fascia.  There is nothing trapped.  Local was infiltrated around the umbilical fascial closure and along the right lateral abdominal wall under laparoscopic visualization.  We again inspected the right upper quadrant for hemostasis.  The umbilical closure was inspected and there was no air leak and nothing trapped within the closure. Pneumoperitoneum was released as we removed the trocars.  4-0 Monocryl was used to close the skin.   Benzoin, steri-strips, and clean dressings were applied. The patient was then extubated and brought to the recovery room in stable condition. Instrument, sponge, and needle counts were correct at closure and at the conclusion of the case.   Findings: Probable chronic cholecystitis with Cholelithiasis  Estimated Blood Loss: Minimal         Drains: None         Specimens: Gallbladder           Complications: None; patient tolerated the procedure well.         Disposition: PACU - hemodynamically stable.         Condition: stable  Mary Sella. Andrey Campanile, MD, FACS General, Bariatric, & Minimally Invasive Surgery [facility], [place]

## 2019-11-19 NOTE — Discharge Instructions (Signed)
[facility] LAPAROSCOPIC SURGERY: POST OP INSTRUCTIONS Always review your discharge instruction sheet given to you by the facility where your surgery was performed. IF YOU HAVE DISABILITY OR FAMILY LEAVE FORMS, YOU MUST BRING THEM TO THE OFFICE FOR PROCESSING.   DO NOT GIVE THEM TO YOUR DOCTOR.  PAIN CONTROL  1. First take acetaminophen (Tylenol) AND/or ibuprofen (Advil) to control your pain after surgery.  Follow directions on package.  Taking acetaminophen (Tylenol) and/or ibuprofen (Advil) regularly after surgery will help to control your pain and lower the amount of prescription pain medication you may need.  You should not take more than 3,000 mg (3 grams) of acetaminophen (Tylenol) in 24 hours.  You should not take ibuprofen (Advil), aleve, motrin, naprosyn or other NSAIDS if you have a history of stomach ulcers or chronic kidney disease.  2. A prescription for pain medication may be given to you upon discharge.  Take your pain medication as prescribed, if you still have uncontrolled pain after taking acetaminophen (Tylenol) or ibuprofen (Advil). 3. Use ice packs to help control pain. 4. If you need a refill on your pain medication, please contact your pharmacy.  They will contact our office to request authorization. Prescriptions will not be filled after 5pm or on week-ends.  HOME MEDICATIONS 5. Take your usually prescribed medications unless otherwise directed.  DIET 6. You should follow a light diet the first few days after arrival home.  Be sure to include lots of fluids daily. Avoid fatty, fried foods.   CONSTIPATION 7. It is common to experience some constipation after surgery and if you are taking pain medication.  Increasing fluid intake and taking a stool softener (such as Colace) will usually help or prevent this problem from occurring.  A mild laxative (Milk of Magnesia or Miralax) should be taken according to package instructions if there are no bowel  movements after 48 hours.  WOUND/INCISION CARE 8. Most patients will experience some swelling and bruising in the area of the incisions.  Ice packs will help.  Swelling and bruising can take several days to resolve.  9. Unless discharge instructions indicate otherwise, follow guidelines below  a. STERI-STRIPS - you may remove your outer bandages 48 hours after surgery, and you may shower at that time.  You have steri-strips (small skin tapes) in place directly over the incision.  These strips should be left on the skin for 7-10 days.   b. DERMABOND/SKIN GLUE - you may shower in 24 hours.  The glue will flake off over the next 2-3 weeks. 10. Any sutures or staples will be removed at the office during your follow-up visit.  ACTIVITIES 11. You may resume regular (light) daily activities beginning the next day--such as daily self-care, walking, climbing stairs--gradually increasing activities as tolerated.  You may have sexual intercourse when it is comfortable.  Refrain from any heavy lifting or straining until approved by your doctor. a. You may drive when you are no longer taking prescription pain medication, you can comfortably wear a seatbelt, and you can safely maneuver your car and apply brakes.  FOLLOW-UP 12. You should see your doctor in the office for a follow-up appointment approximately 2-3 weeks after your surgery.  You should have been given your post-op/follow-up appointment when your surgery was scheduled.  If you did not receive a post-op/follow-up appointment, make sure that you call for this appointment within a day or two after you arrive home to insure a convenient appointment time.  OTHER   INSTRUCTIONS 13.   WHEN TO CALL YOUR DOCTOR: 1. Fever over 101.0 2. Inability to urinate 3. Continued bleeding from incision. 4. Increased pain, redness, or drainage from the incision. 5. Increasing abdominal pain  The clinic staff is available to answer your questions during regular  business hours.  Please don't hesitate to call and ask to speak to one of the nurses for clinical concerns.  If you have a medical emergency, go to the nearest emergency room or call 911.  A surgeon from [facility] is always on call at the hospital. [address], [address], [place], [place]  [address] ? [address] [address], [place], [place]   [address] [phone] ? [phone] ? FAX [phone] Web site: [email or web address]  .........   Managing Your Pain After Surgery Without Opioids    Thank you for participating in our program to help patients manage their pain after surgery without opioids. This is part of our effort to provide you with the best care possible, without exposing you or your family to the risk that opioids pose.  What pain can I expect after surgery? You can expect to have some pain after surgery. This is normal. The pain is typically worse the day after surgery, and quickly begins to get better. Many studies have found that many patients are able to manage their pain after surgery with Over-the-Counter (OTC) medications such as Tylenol and Motrin. If you have a condition that does not allow you to take Tylenol or Motrin, notify your surgical team.  How will I manage my pain? The best strategy for controlling your pain after surgery is around the clock pain control with Tylenol (acetaminophen) and Motrin (ibuprofen or Advil). Alternating these medications with each other allows you to maximize your pain control. In addition to Tylenol and Motrin, you can use heating pads or ice packs on your incisions to help reduce your pain.  How will I alternate your regular strength over-the-counter pain medication? You will take a dose of pain medication every three hours. ; Start by taking 650 mg of Tylenol (2 pills of 325 mg) ; 3 hours later take 600 mg of Motrin (3 pills of 200 mg) ; 3 hours after taking the Motrin take 650 mg of Tylenol ; 3 hours  after that take 600 mg of Motrin.   - 1 -  See example - if your first dose of Tylenol is at 12:00 PM   12:00 PM Tylenol 650 mg (2 pills of 325 mg)  3:00 PM Motrin 600 mg (3 pills of 200 mg)  6:00 PM Tylenol 650 mg (2 pills of 325 mg)  9:00 PM Motrin 600 mg (3 pills of 200 mg)  Continue alternating every 3 hours   We recommend that you follow this schedule around-the-clock for at least 3 days after surgery, or until you feel that it is no longer needed. Use the table on the last page of this handout to keep track of the medications you are taking. Important: Do not take more than 3000mg of Tylenol or 1800mg of Motrin in a 24-hour period. Do not take ibuprofen/Motrin if you have a history of bleeding stomach ulcers, severe kidney disease, &/or actively taking a blood thinner  What if I still have pain? If you have pain that is not controlled with the over-the-counter pain medications (Tylenol and Motrin or Advil) you might have what we call "breakthrough" pain. You will receive a prescription for a small amount of an opioid   pain medication such as Oxycodone, Tramadol, or Tylenol with Codeine. Use these opioid pills in the first 24 hours after surgery if you have breakthrough pain. Do not take more than 1 pill every 4-6 hours.  If you still have uncontrolled pain after using all opioid pills, don't hesitate to call our staff using the number provided. We will help make sure you are managing your pain in the best way possible, and if necessary, we can provide a prescription for additional pain medication.   Day 1    Time  Name of Medication Number of pills taken  Amount of Acetaminophen  Pain Level   Comments  AM PM       AM PM       AM PM       AM PM       AM PM       AM PM       AM PM       AM PM       Total Daily amount of Acetaminophen Do not take more than  3,000 mg per day      Day 2    Time  Name of Medication Number of pills taken  Amount of Acetaminophen  Pain  Level   Comments  AM PM       AM PM       AM PM       AM PM       AM PM       AM PM       AM PM       AM PM       Total Daily amount of Acetaminophen Do not take more than  3,000 mg per day      Day 3    Time  Name of Medication Number of pills taken  Amount of Acetaminophen  Pain Level   Comments  AM PM       AM PM       AM PM       AM PM          AM PM       AM PM       AM PM       AM PM       Total Daily amount of Acetaminophen Do not take more than  3,000 mg per day      Day 4    Time  Name of Medication Number of pills taken  Amount of Acetaminophen  Pain Level   Comments  AM PM       AM PM       AM PM       AM PM       AM PM       AM PM       AM PM       AM PM       Total Daily amount of Acetaminophen Do not take more than  3,000 mg per day      Day 5    Time  Name of Medication Number of pills taken  Amount of Acetaminophen  Pain Level   Comments  AM PM       AM PM       AM PM       AM PM       AM PM       AM PM         AM PM       AM PM       Total Daily amount of Acetaminophen Do not take more than  3,000 mg per day       Day 6    Time  Name of Medication Number of pills taken  Amount of Acetaminophen  Pain Level  Comments  AM PM       AM PM       AM PM       AM PM       AM PM       AM PM       AM PM       AM PM       Total Daily amount of Acetaminophen Do not take more than  3,000 mg per day      Day 7    Time  Name of Medication Number of pills taken  Amount of Acetaminophen  Pain Level   Comments  AM PM       AM PM       AM PM       AM PM       AM PM       AM PM       AM PM       AM PM       Total Daily amount of Acetaminophen Do not take more than  3,000 mg per day        For additional information about how and where to safely dispose of unused opioid medications - https://www.morepowerfulnc.org  Disclaimer: This document contains information and/or instructional materials adapted  from [facility] for the typical patient with your condition. It does not replace medical advice from your health care provider because your experience may differ from that of the typical patient. Talk to your health care provider if you have any questions about this document, your condition or your treatment plan. Adapted from [facility]  General Anesthesia, Adult, Care After This sheet gives you information about how to care for yourself after your procedure. Your health care provider may also give you more specific instructions. If you have problems or questions, contact your health care provider. What can I expect after the procedure? After the procedure, the following side effects are common:  Pain or discomfort at the IV site.  Nausea.  Vomiting.  Sore throat.  Trouble concentrating.  Feeling cold or chills.  Weak or tired.  Sleepiness and fatigue.  Soreness and body aches. These side effects can affect parts of the body that were not involved in surgery. Follow these instructions at home:  For at least 24 hours after the procedure:  Have a responsible adult stay with you. It is important to have someone help care for you until you are awake and alert.  Rest as needed.  Do not: ? Participate in activities in which you could fall or become injured. ? Drive. ? Use heavy machinery. ? Drink alcohol. ? Take sleeping pills or medicines that cause drowsiness. ? Make important decisions or sign legal documents. ? Take care of children on your own. Eating and drinking  Follow any instructions from your health care provider about eating or drinking restrictions.  When you feel hungry, start by eating small amounts of foods that are soft and easy to digest (bland), such as toast. Gradually return to your regular diet.  Drink enough fluid to keep your urine pale yellow.  If you vomit, rehydrate   by drinking water, juice, or clear broth. General  instructions  If you have sleep apnea, surgery and certain medicines can increase your risk for breathing problems. Follow instructions from your health care provider about wearing your sleep device: ? Anytime you are sleeping, including during daytime naps. ? While taking prescription pain medicines, sleeping medicines, or medicines that make you drowsy.  Return to your normal activities as told by your health care provider. Ask your health care provider what activities are safe for you.  Take over-the-counter and prescription medicines only as told by your health care provider.  If you smoke, do not smoke without supervision.  Keep all follow-up visits as told by your health care provider. This is important. Contact a health care provider if:  You have nausea or vomiting that does not get better with medicine.  You cannot eat or drink without vomiting.  You have pain that does not get better with medicine.  You are unable to pass urine.  You develop a skin rash.  You have a fever.  You have redness around your IV site that gets worse. Get help right away if:  You have difficulty breathing.  You have chest pain.  You have blood in your urine or stool, or you vomit blood. Summary  After the procedure, it is common to have a sore throat or nausea. It is also common to feel tired.  Have a responsible adult stay with you for the first 24 hours after general anesthesia. It is important to have someone help care for you until you are awake and alert.  When you feel hungry, start by eating small amounts of foods that are soft and easy to digest (bland), such as toast. Gradually return to your regular diet.  Drink enough fluid to keep your urine pale yellow.  Return to your normal activities as told by your health care provider. Ask your health care provider what activities are safe for you. This information is not intended to replace advice given to you by your health care  provider. Make sure you discuss any questions you have with your health care provider. Document Released: 02/25/2001 Document Revised: 11/22/2017 Document Reviewed: 07/05/2017 Elsevier Patient Education  2020 [employer]vier Inc.   

## 2019-11-20 LAB — SURGICAL PATHOLOGY

## 2022-01-22 NOTE — Progress Notes (Signed)
ACUTE VISIT Chief Complaint  Patient presents with   Dizziness    X 3-4 months, comes at random times. She does experience a pain in the back of her head before the dizziness starts.   HPI: [name] is a 41 y.o. female with hx of allergies and arthritis here today complaining of intermittent episodes of dizziness as described above. New problem that started 3-4 months ago.  Dizziness The current episode started more than 1 month ago. The problem occurs every several days. The problem has been unchanged. Associated symptoms include neck pain and numbness. Pertinent negatives include no abdominal pain, anorexia, arthralgias, change in bowel habit, chest pain, chills, congestion, coughing, diaphoresis, fatigue, fever, headaches, joint swelling, myalgias, nausea, rash, sore throat, swollen glands, urinary symptoms, visual change, vomiting or weakness. She has tried nothing for the symptoms.  Spinning like sensation that last a minutes.  Alleviated by being still for a few seconds. Exacerbated by head movement, standing up,and when lying on bed. She has not noted changes in hearing or tinnitus. No recent URI or travel.  She has not tried OTC medication.  Sometimes post neck pain before or after episodes of dizziness, radiated to shoulders. Exacerbated by movement. No hx of trauma. Occasional UE numbness sensation for the past 4 months, no associated weakness or headache. Not sure about exacerbating or alleviating factors. It has been stable.  Review of Systems  Constitutional:  Negative for chills, diaphoresis, fatigue and fever.  HENT:  Negative for congestion, mouth sores, nosebleeds and sore throat.   Respiratory:  Negative for cough, shortness of breath and wheezing.   Cardiovascular:  Negative for chest pain.  Gastrointestinal:  Negative for abdominal pain, anorexia, change in bowel habit, nausea and vomiting.  Endocrine: Negative for cold intolerance, heat intolerance,  polydipsia, polyphagia and polyuria.  Musculoskeletal:  Positive for neck pain. Negative for arthralgias, joint swelling and myalgias.  Skin:  Negative for rash.  Allergic/Immunologic: Positive for environmental allergies.  Neurological:  Positive for dizziness and numbness. Negative for syncope, weakness and headaches.  Hematological:  Negative for adenopathy. Does not bruise/bleed easily.  Rest see pertinent positives and negatives per HPI.  No current outpatient medications on file prior to visit.   No current facility-administered medications on file prior to visit.   Past Medical History:  Diagnosis Date   Arthritis    Cervical lymphadenopathy    Mass    left side of neck   Seasonal allergies    Wears glasses    Allergies  Allergen Reactions   Doxycycline Hyclate Rash   Social History   Socioeconomic History   Marital status: Single    Spouse name: Not on file   Number of children: Not on file   Years of education: Not on file   Highest education level: Some college, no degree  Occupational History   Not on file  Tobacco Use   Smoking status: Never   Smokeless tobacco: Never  Vaping Use   Vaping Use: Never used  Substance and Sexual Activity   Alcohol use: Yes    Comment: occasional   Drug use: No   Sexual activity: Yes    Partners: Female    Birth control/protection: Abstinence  Other Topics Concern   Not on file  Social History Narrative   Not on file   Social Determinants of Health   Financial Resource Strain: Low Risk    Difficulty of Paying Living Expenses: Not hard at all  Food Insecurity: No  Food Insecurity   Worried About Programme researcher, broadcasting/film/video in the Last Year: Never true   Ran Out of Food in the Last Year: Never true  Transportation Needs: No Transportation Needs   Lack of Transportation (Medical): No   Lack of Transportation (Non-Medical): No  Physical Activity: Insufficiently Active   Days of Exercise per Week: 2 days   Minutes of Exercise  per Session: 30 min  Stress: No Stress Concern Present   Feeling of Stress : Only a little  Social Connections: Moderately Isolated   Frequency of Communication with Friends and Family: More than three times a week   Frequency of Social Gatherings with Friends and Family: Three times a week   Attends Religious Services: 1 to 4 times per year   Active Member of Clubs or Organizations: No   Attends Banker Meetings: Not on file   Marital Status: Never married   Vitals:   01/23/22 1451  BP: 130/80  Pulse: 75  Resp: 16  SpO2: 98%   Body mass index is 39.74 kg/m.  Physical Exam Vitals and nursing note reviewed.  Constitutional:      General: She is not in acute distress.    Appearance: She is well-developed.  HENT:     Head: Normocephalic and atraumatic.     Right Ear: Tympanic membrane, ear canal and external ear normal.     Left Ear: Tympanic membrane, ear canal and external ear normal.     Ears:     Comments: Dix-Hallpike maneuver positive left, mild nystagmus associated.     Mouth/Throat:     Mouth: Mucous membranes are moist.     Pharynx: Oropharynx is clear.  Eyes:     Conjunctiva/sclera: Conjunctivae normal.  Cardiovascular:     Rate and Rhythm: Normal rate and regular rhythm.     Heart sounds: No murmur heard. Pulmonary:     Effort: Pulmonary effort is normal. No respiratory distress.     Breath sounds: Normal breath sounds.  Abdominal:     Palpations: Abdomen is soft. There is no mass.     Tenderness: There is no abdominal tenderness.  Musculoskeletal:     Cervical back: Tenderness present. No edema or bony tenderness. No pain with movement. Normal range of motion.       Back:  Lymphadenopathy:     Cervical: No cervical adenopathy.  Skin:    General: Skin is warm.     Findings: No erythema or rash.  Neurological:     General: No focal deficit present.     Mental Status: She is alert and oriented to person, place, and time.     Cranial  Nerves: No cranial nerve deficit.     Gait: Gait normal.     Deep Tendon Reflexes:     Reflex Scores:      Bicep reflexes are 2+ on the right side and 2+ on the left side.      Patellar reflexes are 2+ on the right side and 2+ on the left side. Psychiatric:     Comments: Well groomed, good eye contact.   ASSESSMENT AND PLAN:  Jennifer Gutierrez was seen today for dizziness.  Diagnoses and all orders for this visit: Orders Placed This Encounter  Procedures   Comprehensive metabolic panel   TSH   Hemoglobin A1c   Vitamin B12   CBC   Lab Results  Component Value Date   WBC 5.7 01/23/2022   HGB 12.9 01/23/2022   HCT 37.9  01/23/2022   MCV 94.7 01/23/2022   PLT 349.0 01/23/2022   Lab Results  Component Value Date   VITAMINB12 270 01/23/2022   Lab Results  Component Value Date   HGBA1C 5.5 01/23/2022   Lab Results  Component Value Date   ALT 13 01/23/2022   AST 16 01/23/2022   ALKPHOS 47 01/23/2022   BILITOT 0.4 01/23/2022   Lab Results  Component Value Date   CREATININE 0.80 01/23/2022   BUN 12 01/23/2022   NA 135 01/23/2022   K 4.0 01/23/2022   CL 102 01/23/2022   CO2 30 01/23/2022   Lab Results  Component Value Date   TSH 1.37 01/23/2022   Benign paroxysmal positional vertigo of left ear We discussed other possible etiologies of dizziness, Hx and examination today suggest benign vertigo. We discussed differential Dx's. Explained that problem can be recurrent. Fall prevention. Vestibular exercises recommended, handout with Semont maneuvers given. Meclizine 25 mg tid prn, some side effects discussed. Instructed about warning signs. F/U as needed. -     meclizine (ANTIVERT) 25 MG tablet; Take 1 tablet (25 mg total) by mouth 3 (three) times daily as needed for dizziness.  Extremity numbness Neuro examination today negative. ? Radiculopathy. I do not think imaging is needed today, will obtain blood work today and further recommendations will be given  accordingly. Cervical MRI and/or EMG will be useful if problem is persist and blood work is negative.  Cervicalgia Seems musculoskeletal and benign. Local massage, icy hot may help. ROM exercises. PT can be considered if persistent. Methocarbamol  500 mg tid prn, mainly at bedtime for a few days may help. Side effects discussed.  -     methocarbamol (ROBAXIN) 500 MG tablet; Take 1 tablet (500 mg total) by mouth every 8 (eight) hours as needed for muscle spasms.  Return if symptoms worsen or fail to improve.  Carlyle Achenbach G. [place], MD  [facility]. [place] office.

## 2022-01-23 ENCOUNTER — Ambulatory Visit: Payer: Managed Care, Other (non HMO) | Admitting: Family Medicine

## 2022-01-23 VITALS — BP 130/80 | HR 75 | Resp 16 | Ht 68.0 in | Wt 261.4 lb

## 2022-01-23 DIAGNOSIS — M542 Cervicalgia: Secondary | ICD-10-CM

## 2022-01-23 DIAGNOSIS — H8112 Benign paroxysmal vertigo, left ear: Secondary | ICD-10-CM

## 2022-01-23 DIAGNOSIS — R200 Anesthesia of skin: Secondary | ICD-10-CM | POA: Diagnosis not present

## 2022-01-23 MED ORDER — METHOCARBAMOL 500 MG PO TABS: 500.0000 mg | ORAL_TABLET | Freq: Three times a day (TID) | ORAL | 0 refills | Status: AC | PRN

## 2022-01-23 MED ORDER — MECLIZINE HCL 25 MG PO TABS: 25.0000 mg | ORAL_TABLET | Freq: Three times a day (TID) | ORAL | 0 refills | Status: AC | PRN

## 2022-01-23 NOTE — Patient Instructions (Addendum)
A few things to remember from today's visit:  Benign paroxysmal positional vertigo of left ear - Plan: meclizine (ANTIVERT) 25 MG tablet  Extremity numbness - Plan: Comprehensive metabolic panel, TSH, Hemoglobin A1c, Vitamin B12, CBC  Cervicalgia  If you need refills please call your pharmacy. Do not use My Chart to request refills or for acute issues that need immediate attention.   Please be sure medication list is accurate. If a new problem present, please set up appointment sooner than planned today.  Dizziness is a perception of movement, it is sometimes difficult to describe and can be  caused by different problems, most benign but others can be life threaten.  Vertigo is the most common cause of dizziness, usually related with inner ear and can be associated with nausea, vomiting, and unbalance sensation. It can be complicated by falls due to lose of balance; so fall precautions are very important.  Most of the time dizziness is benign, usually intermittent, last a few seconds at the time and aggravated by certain positions. It usually resolves in a few weeks without residual effect but it could be recurrent.  Sometimes blood work is ordered to evaluate for other possible causes.  Dizziness can also be caused by certain medications, dehydration, migraines, and strokes.  Medication prescribed for vertigo, Meclizine, causes drowsiness/sleepiness, so frequently I recommended taking it at bedtime. I also recommend what we called vestibular exercise, sometimes can be done at home (Modified Semont maneuvers) other times I refer patients to vestibular rehabilitation.   Seek immediate medical attention if: New severe headache, dobble vision, fever (100 F or more), associated numbness/tingling, focal weakness, persistent vomiting, not able to walk, or sudden worsening symptoms.

## 2022-01-24 LAB — HEMOGLOBIN A1C: Hgb A1c MFr Bld: 5.5 % (ref 4.6–6.5)

## 2022-01-24 LAB — COMPREHENSIVE METABOLIC PANEL
ALT: 13 U/L (ref 0–35)
AST: 16 U/L (ref 0–37)
Albumin: 4.1 g/dL (ref 3.5–5.2)
Alkaline Phosphatase: 47 U/L (ref 39–117)
BUN: 12 mg/dL (ref 6–23)
CO2: 30 mEq/L (ref 19–32)
Calcium: 9.2 mg/dL (ref 8.4–10.5)
Chloride: 102 mEq/L (ref 96–112)
Creatinine, Ser: 0.8 mg/dL (ref 0.40–1.20)
GFR: 92.07 mL/min (ref >60.00)
Glucose, Bld: 76 mg/dL (ref 70–99)
Potassium: 4 mEq/L (ref 3.5–5.1)
Sodium: 135 mEq/L (ref 135–145)
Total Bilirubin: 0.4 mg/dL (ref 0.2–1.2)
Total Protein: 7.6 g/dL (ref 6.0–8.3)

## 2022-01-24 LAB — CBC
HCT: 37.9 % (ref 36.0–46.0)
Hemoglobin: 12.9 g/dL (ref 12.0–15.0)
MCHC: 33.9 g/dL (ref 30.0–36.0)
MCV: 94.7 fl (ref 78.0–100.0)
Platelets: 349 10*3/uL (ref 150.0–400.0)
RBC: 4.01 Mil/uL (ref 3.87–5.11)
RDW: 13 % (ref 11.5–15.5)
WBC: 5.7 10*3/uL (ref 4.0–10.5)

## 2022-01-24 LAB — TSH: TSH: 1.37 u[IU]/mL (ref 0.35–5.50)

## 2022-01-24 LAB — VITAMIN B12: Vitamin B-12: 270 pg/mL (ref 211–911)

## 2022-02-09 ENCOUNTER — Other Ambulatory Visit: Payer: Self-pay | Admitting: Obstetrics

## 2022-02-09 DIAGNOSIS — R928 Other abnormal and inconclusive findings on diagnostic imaging of breast: Secondary | ICD-10-CM

## 2022-03-06 ENCOUNTER — Ambulatory Visit
Admission: RE | Admit: 2022-03-06 | Discharge: 2022-03-06 | Disposition: A | Discharge status: Home / Self Care | Payer: Managed Care, Other (non HMO) | Source: Ambulatory Visit | Attending: Obstetrics | Admitting: Obstetrics

## 2022-03-06 ENCOUNTER — Ambulatory Visit
Admission: RE | Admit: 2022-03-06 | Discharge: 2022-03-06 | Disposition: A | Discharge status: Home / Self Care | Payer: Managed Care, Other (non HMO) | Source: Ambulatory Visit | Attending: Family Medicine | Admitting: Family Medicine

## 2022-03-06 ENCOUNTER — Other Ambulatory Visit: Payer: Self-pay | Admitting: Obstetrics

## 2022-03-06 ENCOUNTER — Ambulatory Visit
Admission: RE | Admit: 2022-03-06 | Discharge: 2022-03-06 | Disposition: A | Discharge status: Home / Self Care | Payer: Managed Care, Other (non HMO) | Source: Ambulatory Visit

## 2022-03-06 DIAGNOSIS — N6320 Unspecified lump in the left breast, unspecified quadrant: Secondary | ICD-10-CM

## 2022-03-06 DIAGNOSIS — R928 Other abnormal and inconclusive findings on diagnostic imaging of breast: Secondary | ICD-10-CM

## 2022-03-06 DIAGNOSIS — N6310 Unspecified lump in the right breast, unspecified quadrant: Secondary | ICD-10-CM

## 2022-09-07 ENCOUNTER — Other Ambulatory Visit: Payer: Managed Care, Other (non HMO)

## 2022-10-09 IMAGING — US US BREAST*R* LIMITED INC AXILLA
2 series · 13 of 13 positions shown · non-contrast
Comparison: Previous exam(s).

CLINICAL DATA: 40-year-old female for further evaluation of
possible bilateral breast masses on baseline screening mammogram.



[Series 1: us breast*right* limited inc axilla · 0.06mm/px · 9 of 9 slices shown (1 of 2)]
[im 1/9]
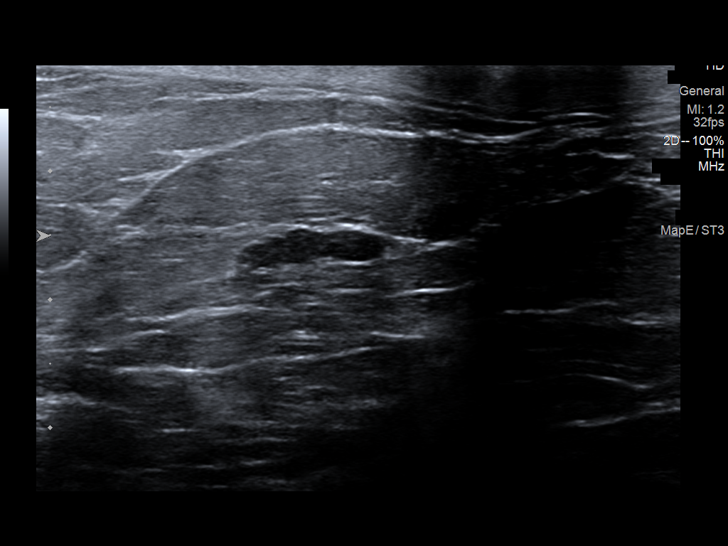
[im 2/9]
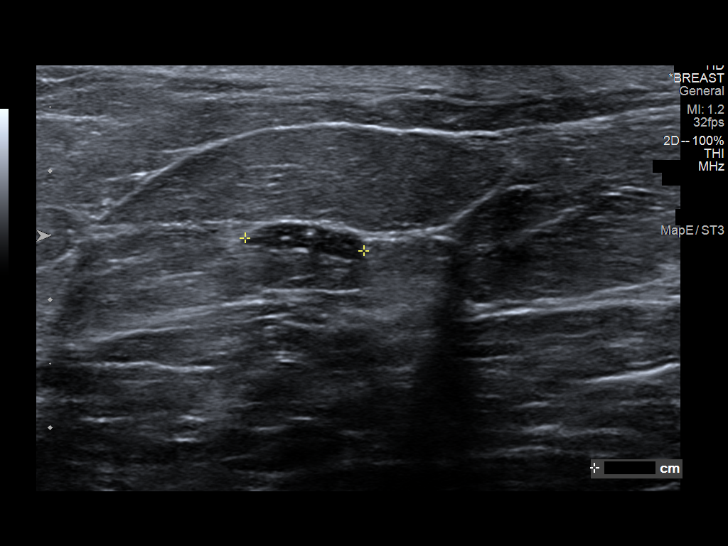
[im 3/9]
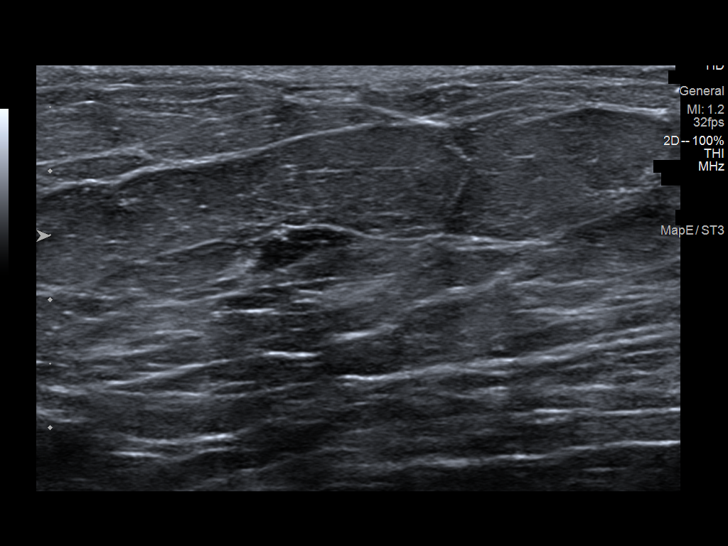
[im 4/9]
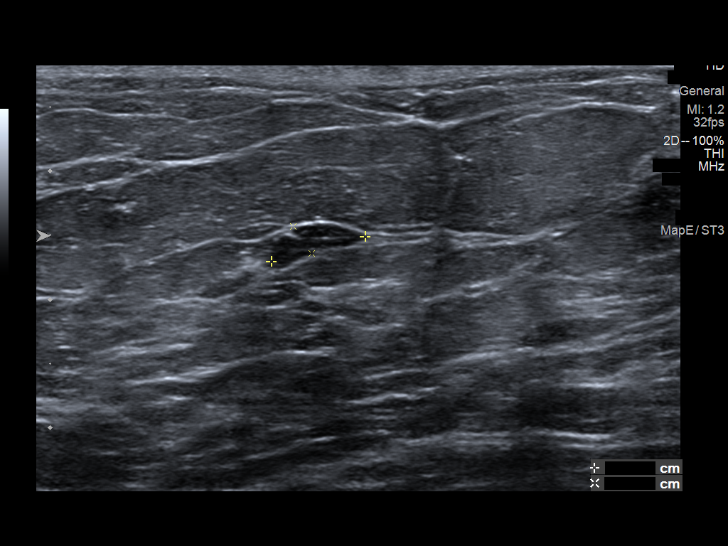
[im 5/9]
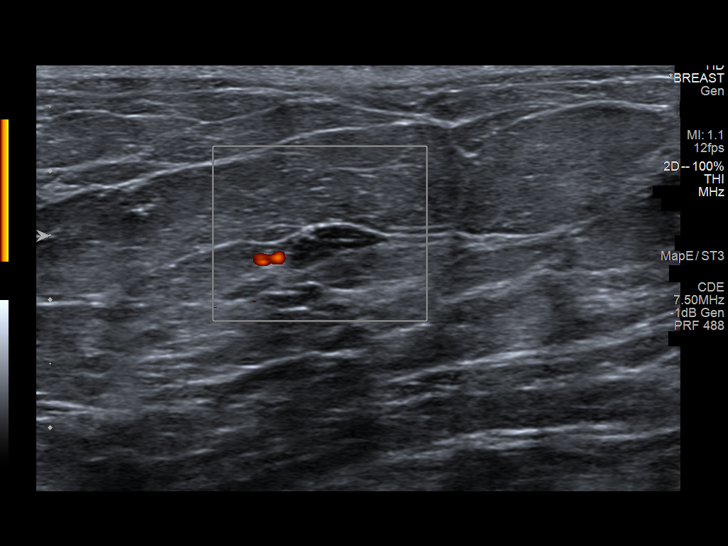
[im 6/9]
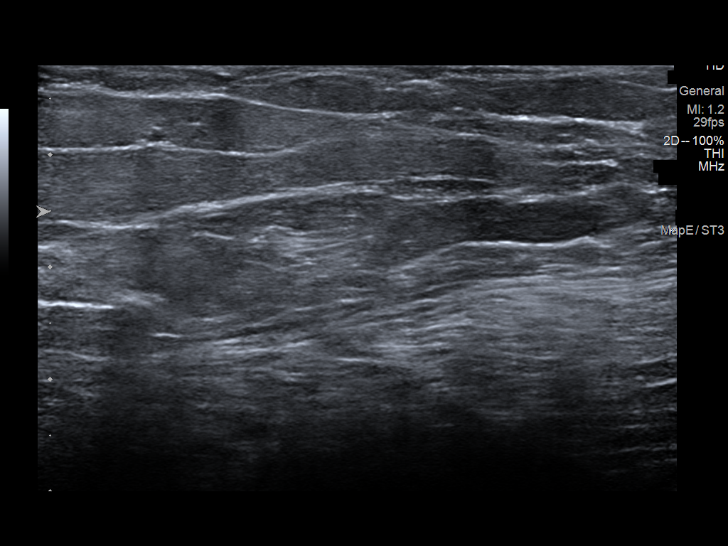
[im 7/9]
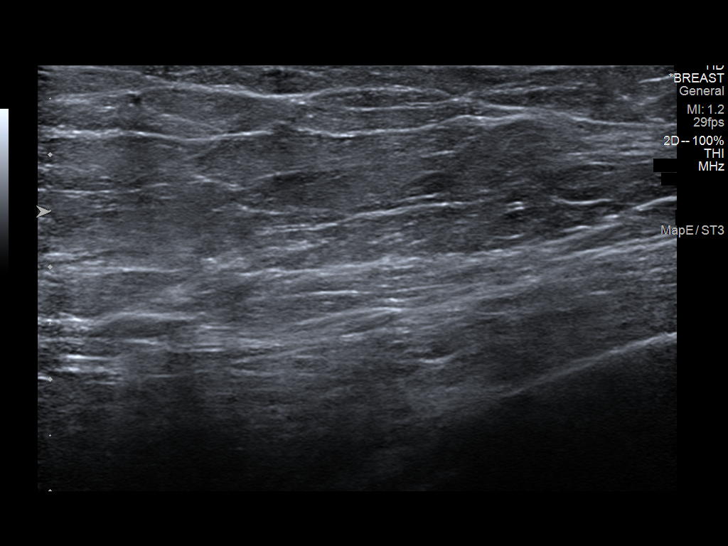
[im 8/9]
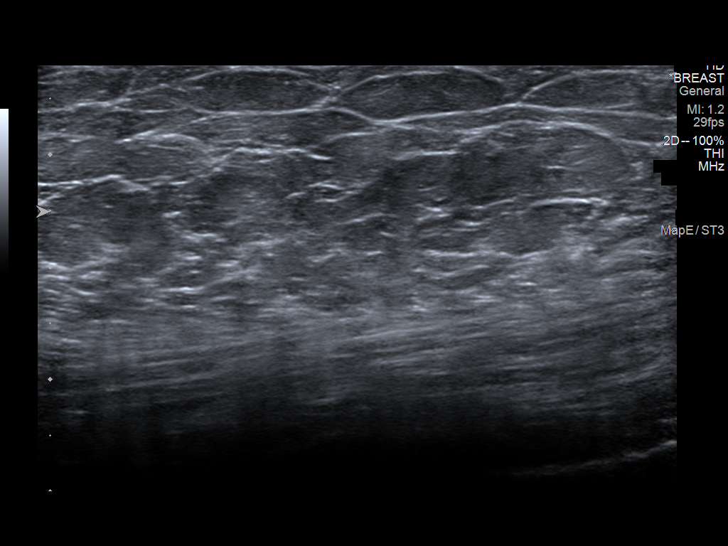
[im 9/9]
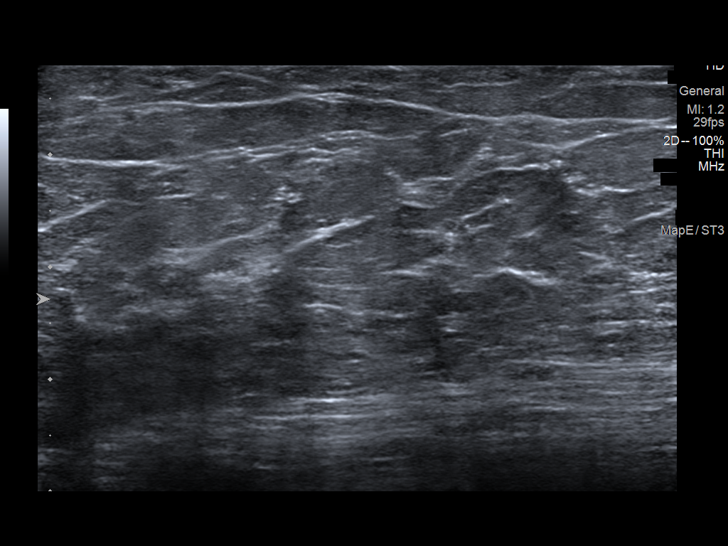

[Series 2: us breast*right* limited inc axilla · 0.06mm/px · 4 of 4 slices shown (2 of 2)]
[im 1/4]
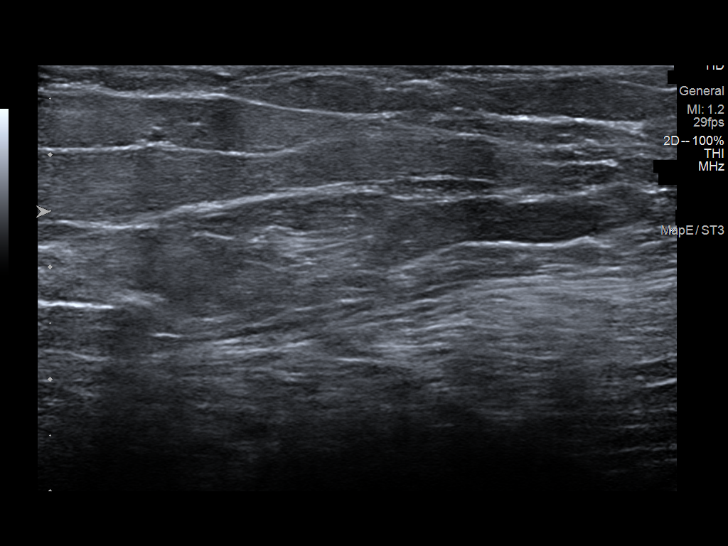
[im 2/4]
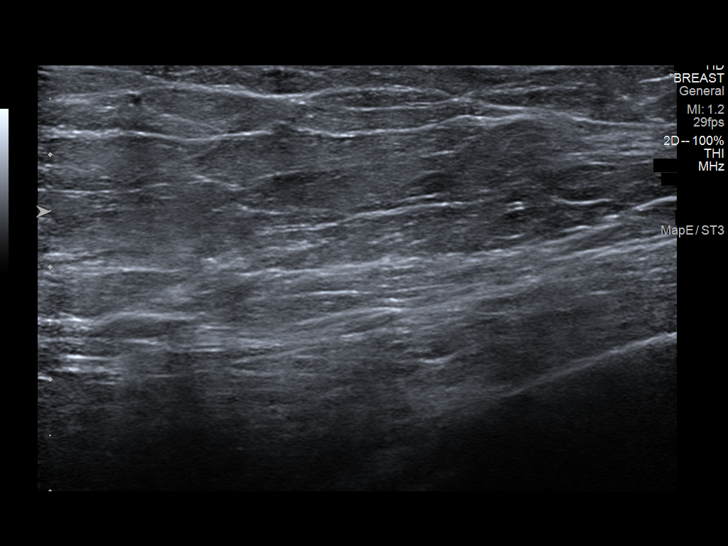
[im 3/4]
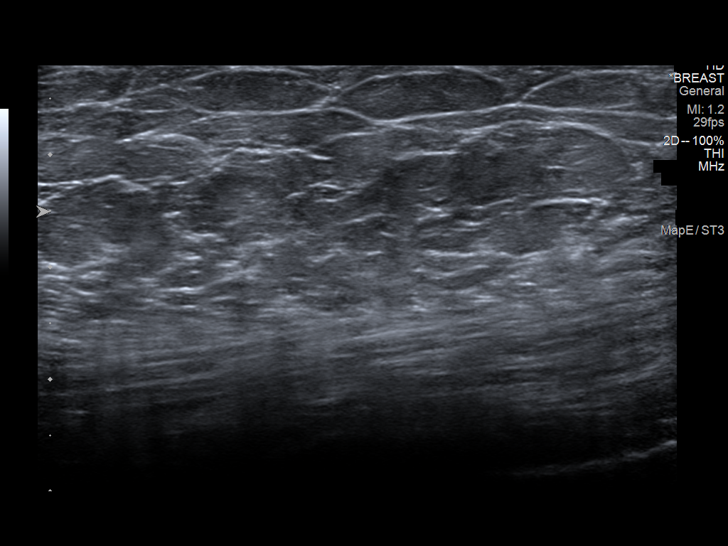
[im 4/4]
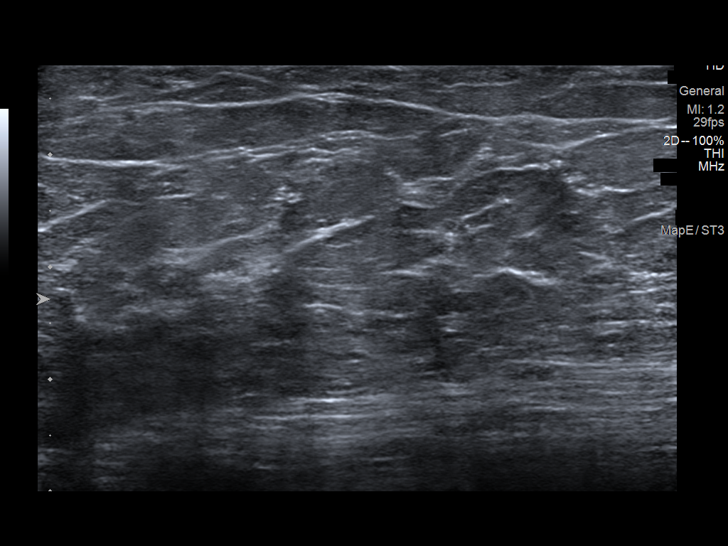

[13 of 13 positions shown; findings below may reference images not displayed]

ACR Breast Density Category b: There are scattered areas of
fibroglandular density.
FINDINGS: Spot compression views of both breasts and a full field LATERAL view
of the LEFT breast are performed.

A persistent circumscribed oval mass within the slightly INNER
central RIGHT breast with 2 punctate calcifications is identified.

A persistent circumscribed oval mass with calcifications in the
RETROAREOLAR LEFT breast is identified.

No other persistent abnormalities are noted.

Targeted ultrasound is performed, showing the following:

RIGHT breast: A 0.8 x 0.3 x 0.9 cm circumscribed oval hypoechoic
mass containing 2 punctate calcifications in the UPPER RETROAREOLAR
RIGHT breast, corresponding to the screening study finding.

LEFT breast: A 0.7 x 0.2 x 0.6 cm circumscribed oval hypoechoic
parallel mass containing calcifications at the 9 o'clock position 1
cm from the nipple, corresponding to the screening study finding.
IMPRESSION: 1. Likely benign masses/probable fibroadenomas within both breasts
corresponding to the screening study findings. Six-month follow-up
recommended to ensure stability.

RECOMMENDATION:
Bilateral breast ultrasound in 6 months.

I have discussed the findings and recommendations with the patient.
If applicable, a reminder letter will be sent to the patient
regarding the next appointment.

BI-RADS CATEGORY  3: Probably benign.

## 2022-10-09 IMAGING — MG DIGITAL DIAGNOSTIC BILAT W/ TOMO W/ CAD
6 of 12 series · 6 of 36 positions shown · non-contrast
Comparison: Previous exam(s).

CLINICAL DATA: 40-year-old female for further evaluation of
possible bilateral breast masses on baseline screening mammogram.



[L CC synth-2D (1 of 2)]
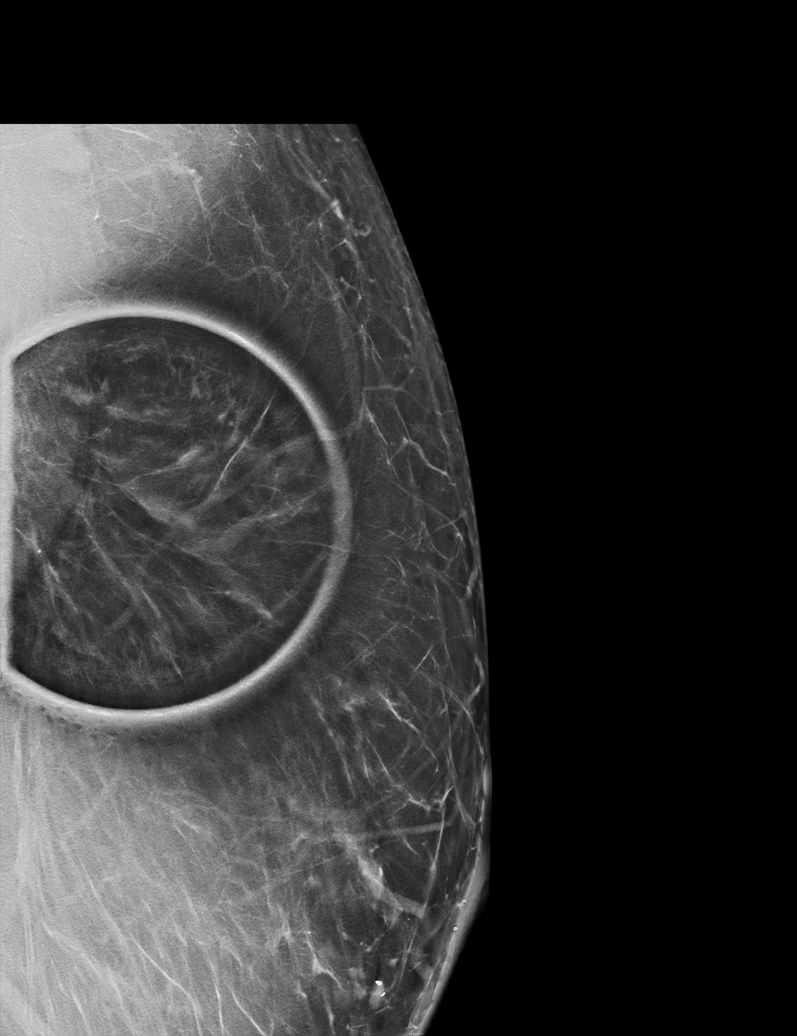

[L MLO synth-2D]
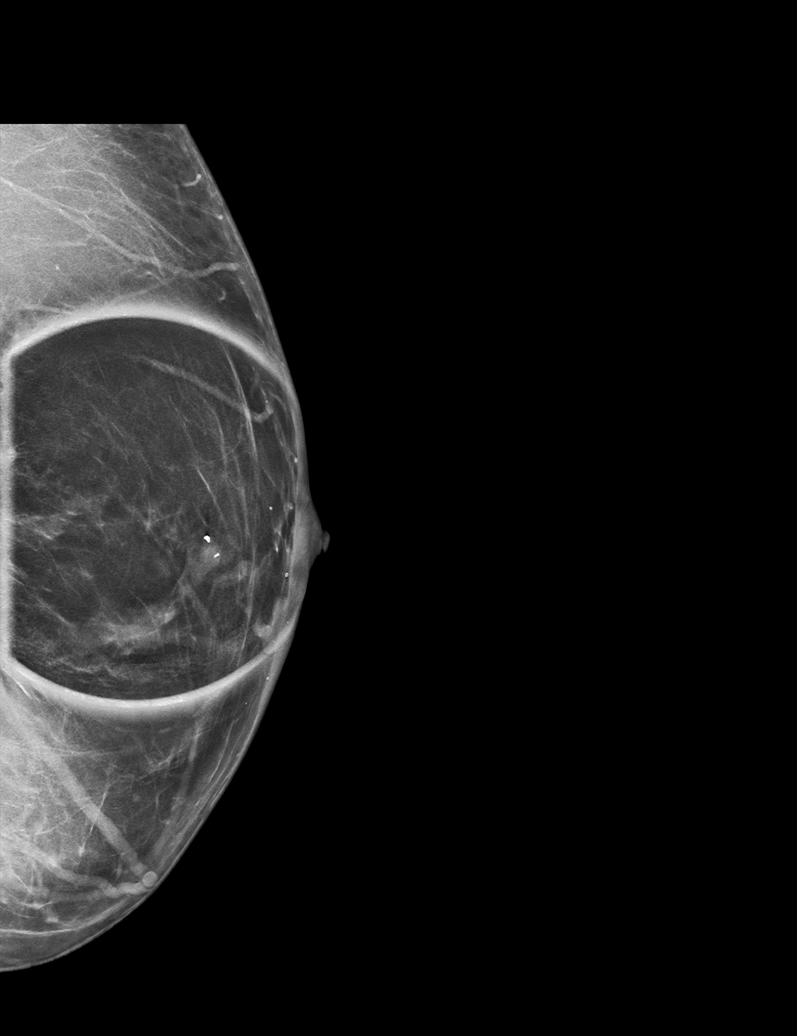

[L ML synth-2D]
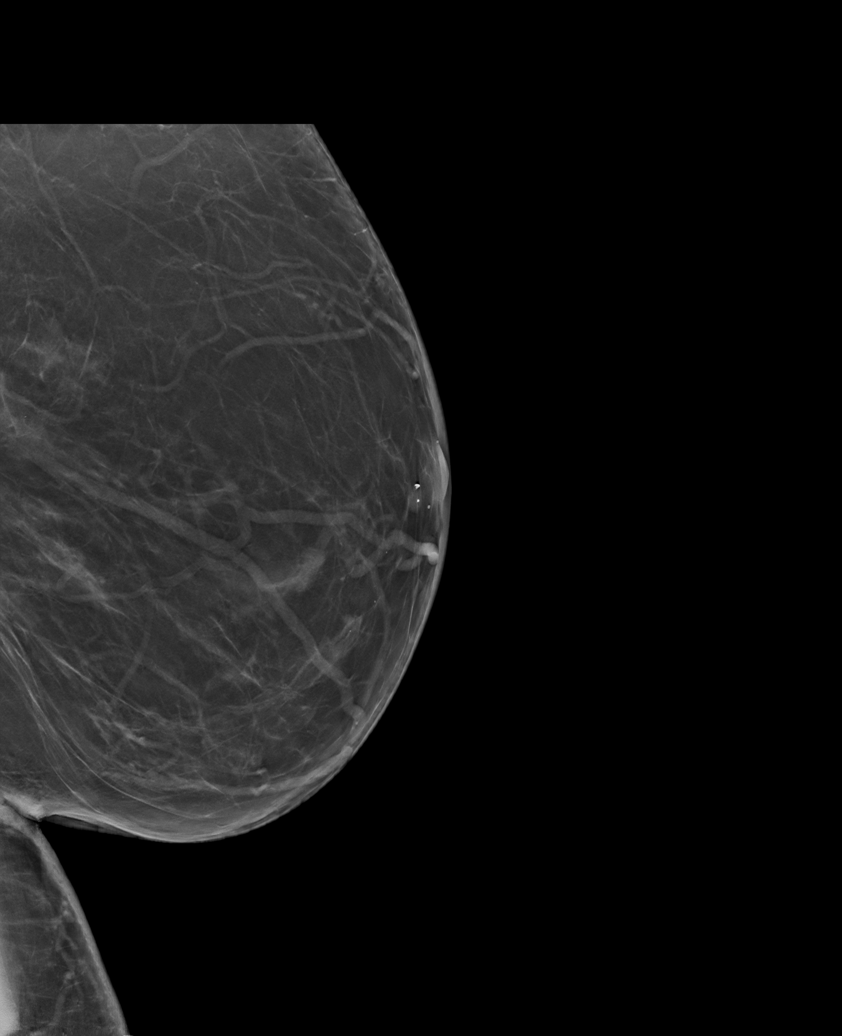

[R CC synth-2D]
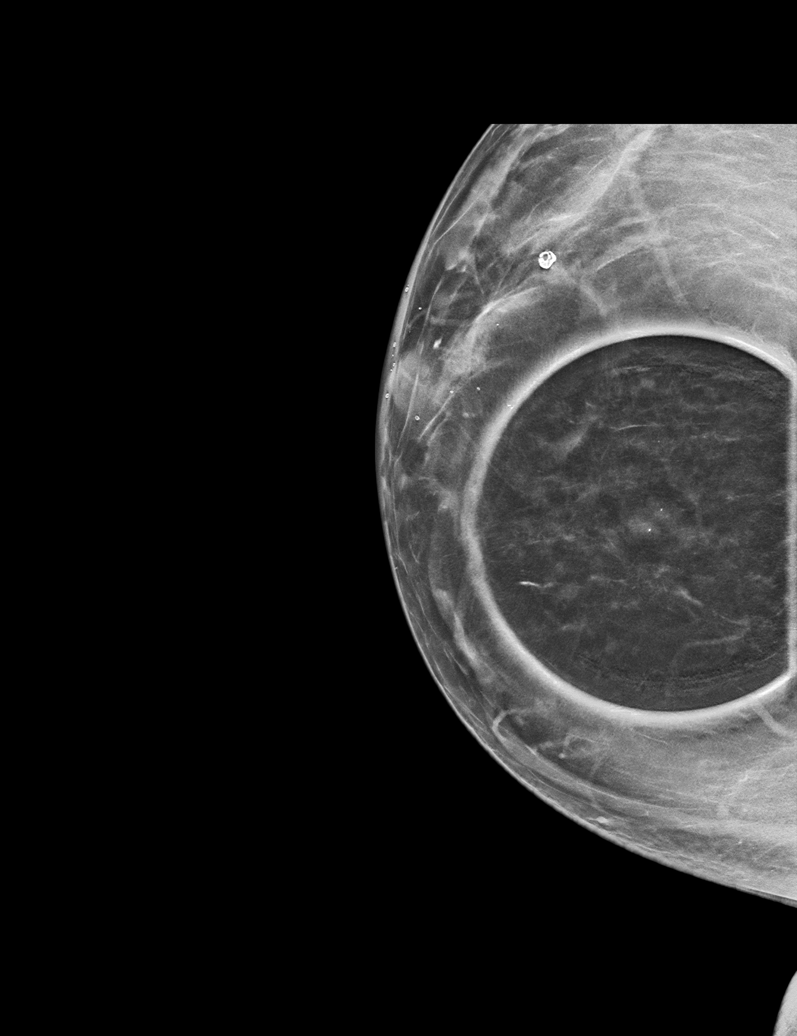

[R MLO synth-2D]
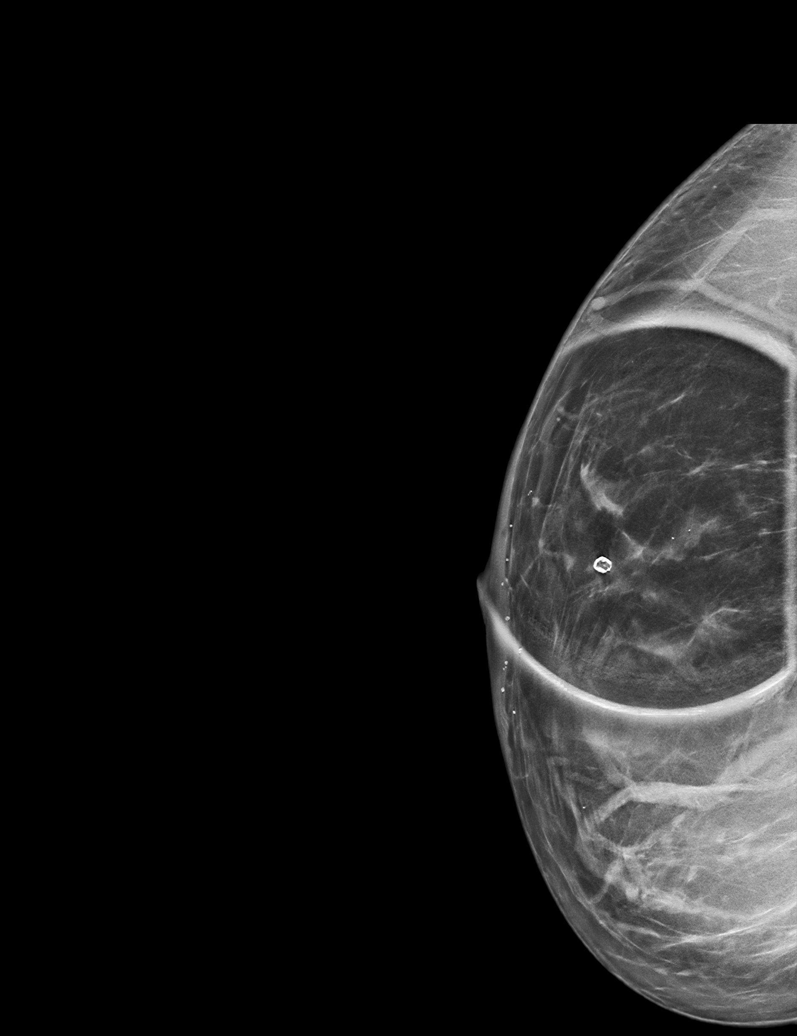

[L CC synth-2D (2 of 2)]
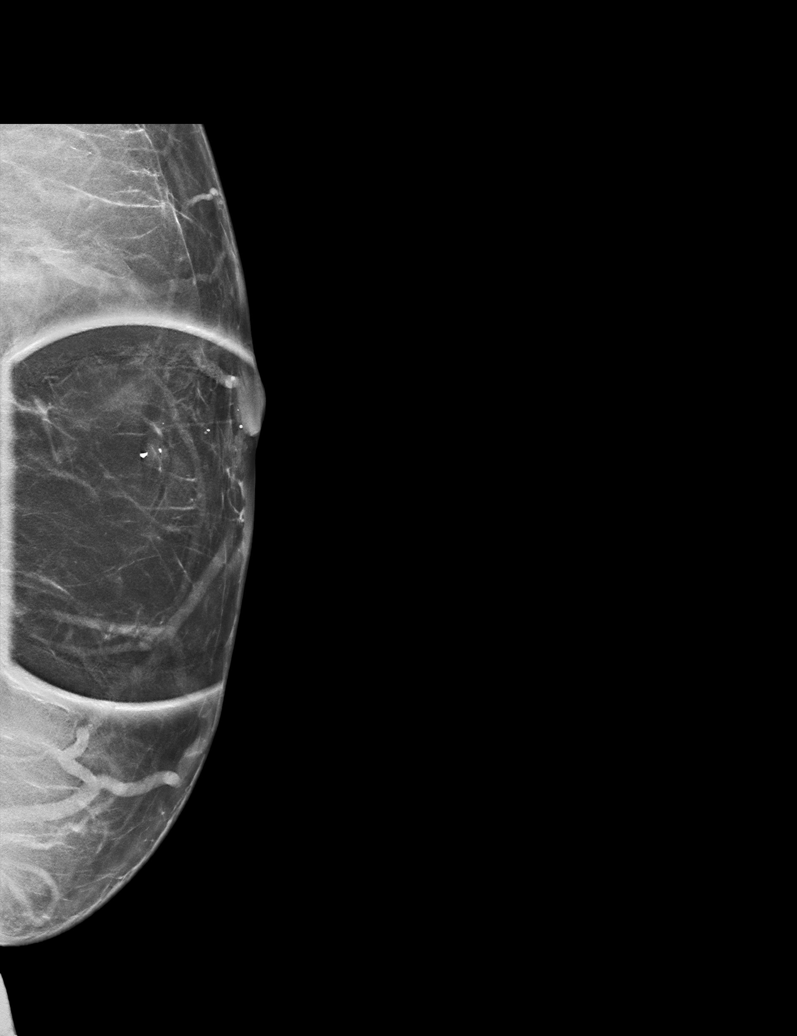

[6 of 36 positions shown; findings below may reference images not displayed]

ACR Breast Density Category b: There are scattered areas of
fibroglandular density.
FINDINGS: Spot compression views of both breasts and a full field LATERAL view
of the LEFT breast are performed.

A persistent circumscribed oval mass within the slightly INNER
central RIGHT breast with 2 punctate calcifications is identified.

A persistent circumscribed oval mass with calcifications in the
RETROAREOLAR LEFT breast is identified.

No other persistent abnormalities are noted.

Targeted ultrasound is performed, showing the following:

RIGHT breast: A 0.8 x 0.3 x 0.9 cm circumscribed oval hypoechoic
mass containing 2 punctate calcifications in the UPPER RETROAREOLAR
RIGHT breast, corresponding to the screening study finding.

LEFT breast: A 0.7 x 0.2 x 0.6 cm circumscribed oval hypoechoic
parallel mass containing calcifications at the 9 o'clock position 1
cm from the nipple, corresponding to the screening study finding.
IMPRESSION: 1. Likely benign masses/probable fibroadenomas within both breasts
corresponding to the screening study findings. Six-month follow-up
recommended to ensure stability.

RECOMMENDATION:
Bilateral breast ultrasound in 6 months.

I have discussed the findings and recommendations with the patient.
If applicable, a reminder letter will be sent to the patient
regarding the next appointment.

BI-RADS CATEGORY  3: Probably benign.

## 2022-10-09 IMAGING — US US BREAST*L* LIMITED INC AXILLA
1 series · 6 of 6 positions shown · non-contrast
Comparison: Previous exam(s).

CLINICAL DATA: 40-year-old female for further evaluation of
possible bilateral breast masses on baseline screening mammogram.



[Series 1: us breast*left* limited inc axilla · 0.05mm/px · 6 of 6 slices shown]
[im 1/6]
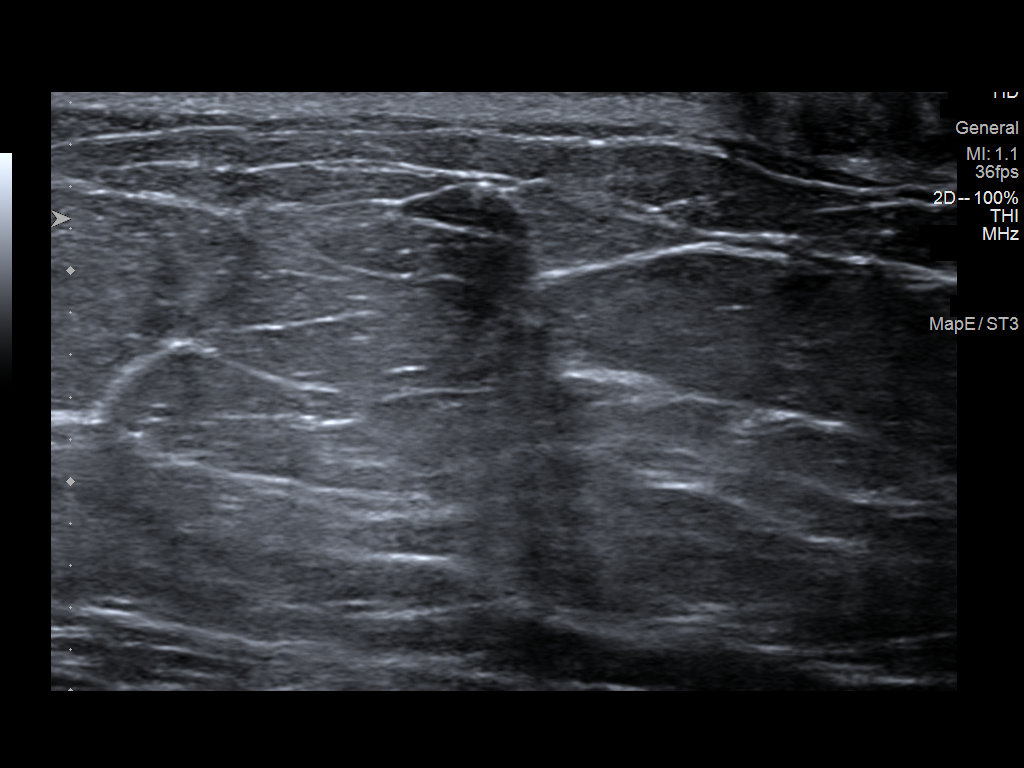
[im 2/6]
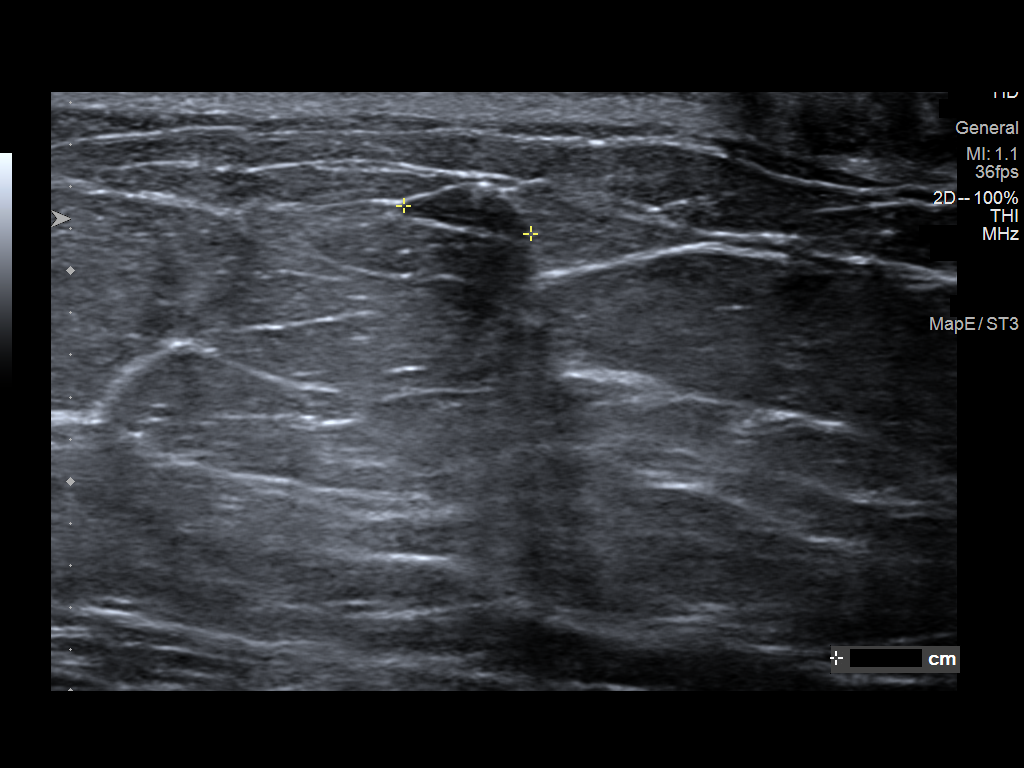
[im 3/6]
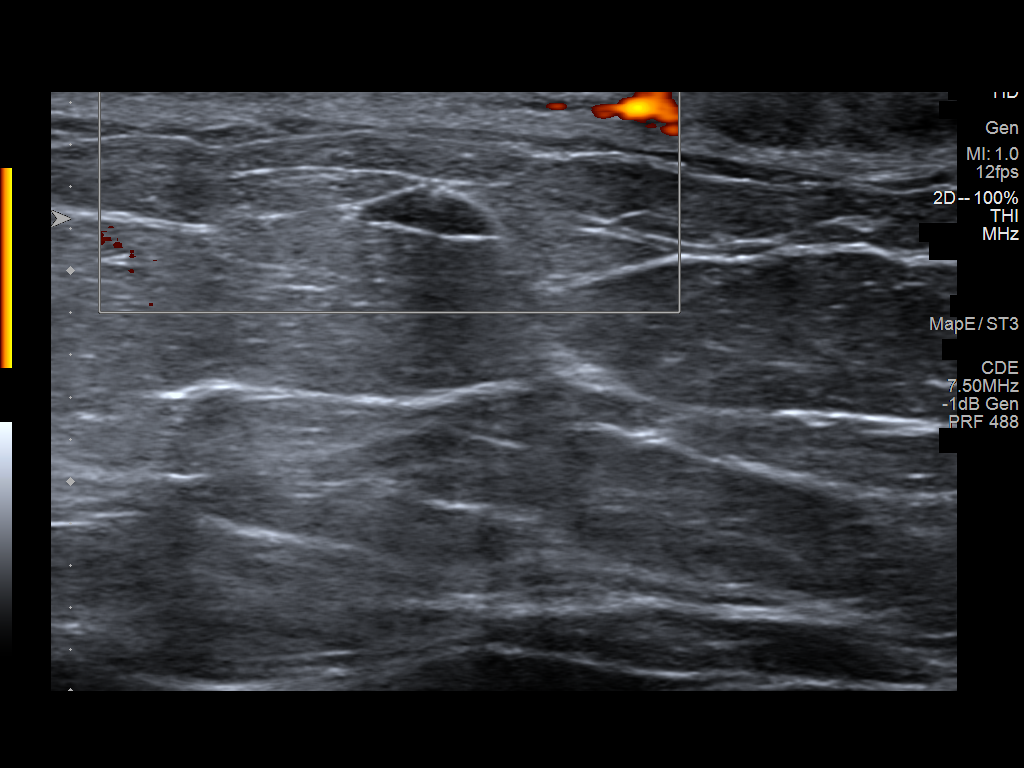
[im 4/6]
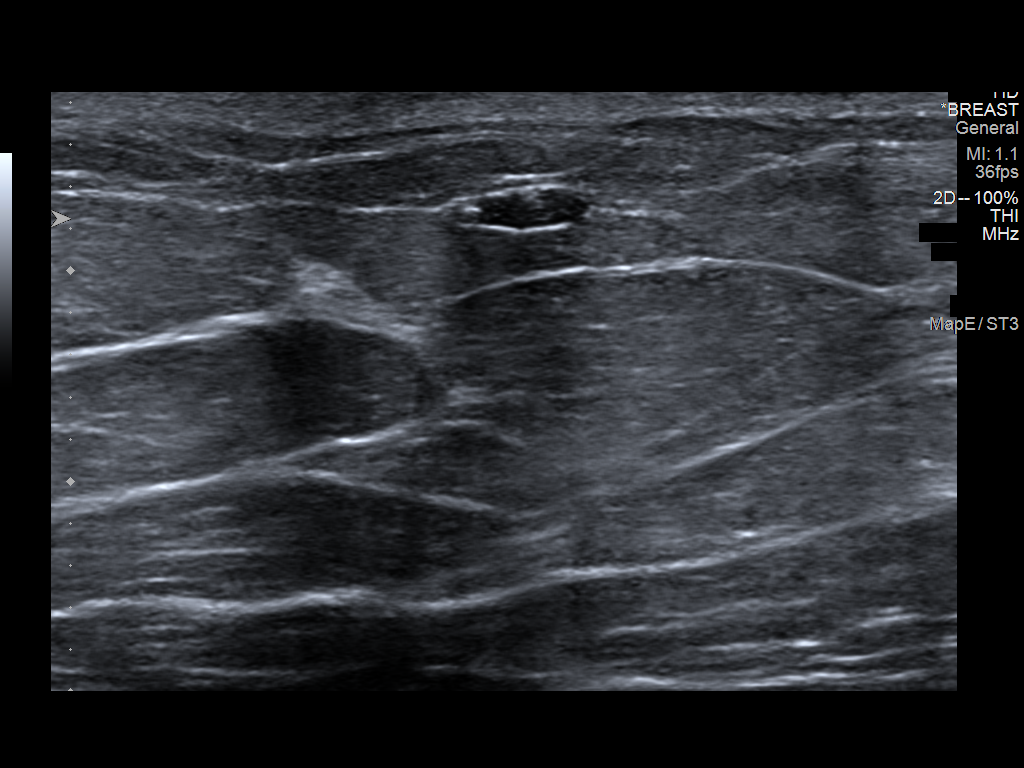
[im 5/6]
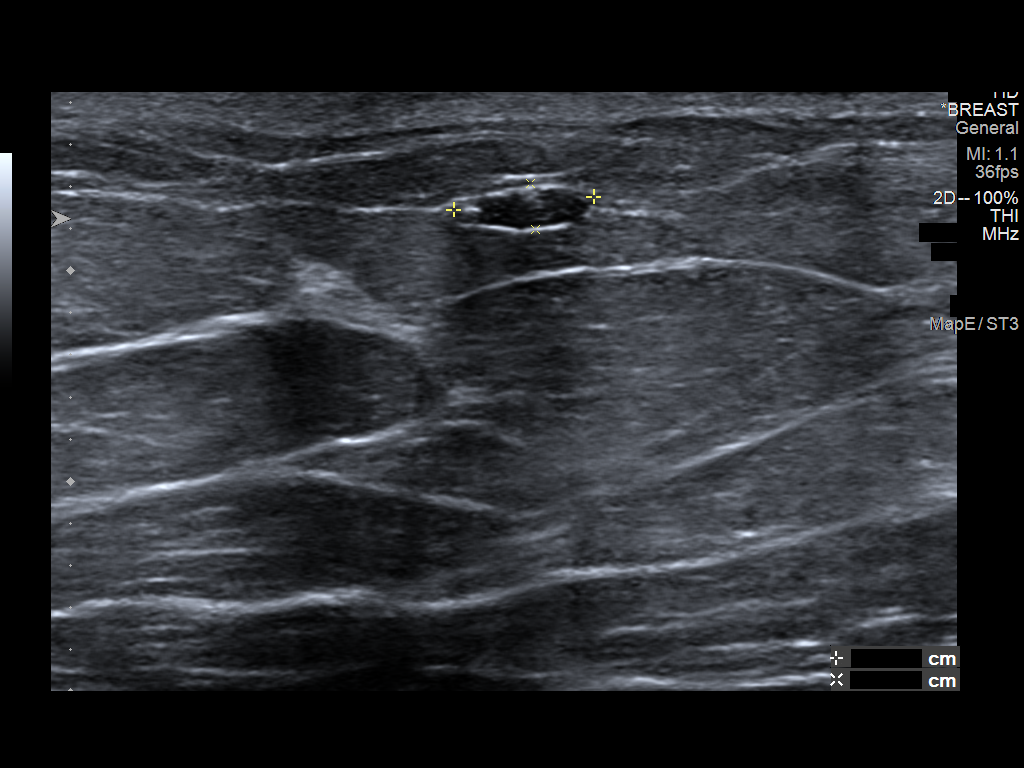
[im 6/6]
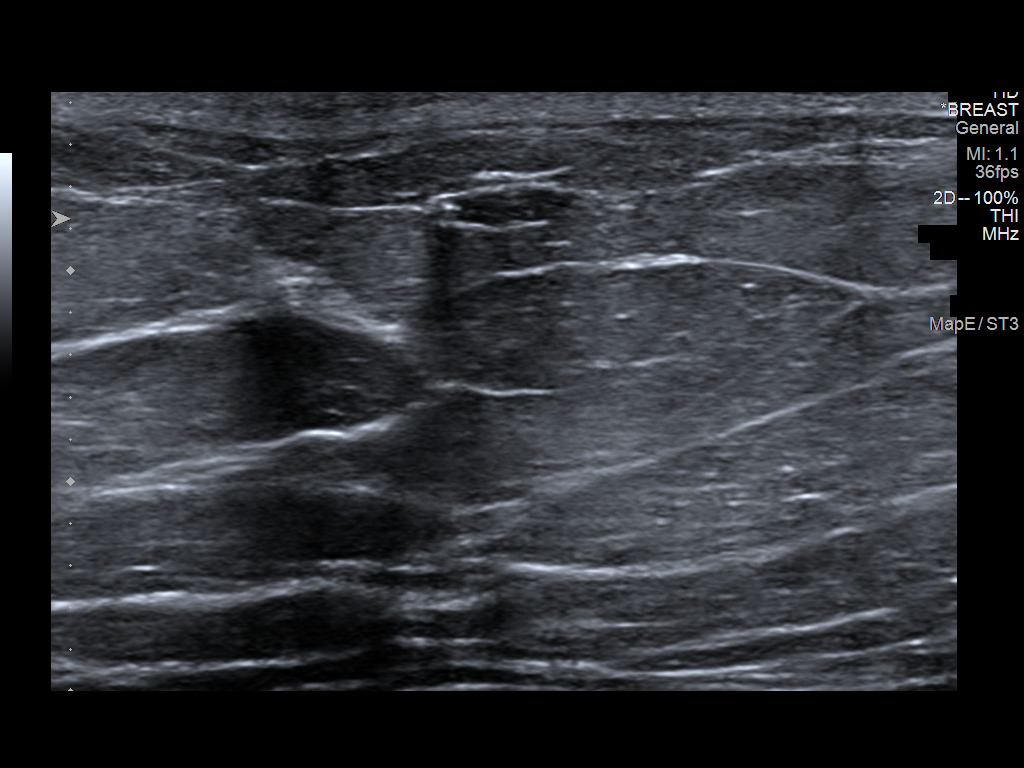

[6 of 6 positions shown; findings below may reference images not displayed]

ACR Breast Density Category b: There are scattered areas of
fibroglandular density.
FINDINGS: Spot compression views of both breasts and a full field LATERAL view
of the LEFT breast are performed.

A persistent circumscribed oval mass within the slightly INNER
central RIGHT breast with 2 punctate calcifications is identified.

A persistent circumscribed oval mass with calcifications in the
RETROAREOLAR LEFT breast is identified.

No other persistent abnormalities are noted.

Targeted ultrasound is performed, showing the following:

RIGHT breast: A 0.8 x 0.3 x 0.9 cm circumscribed oval hypoechoic
mass containing 2 punctate calcifications in the UPPER RETROAREOLAR
RIGHT breast, corresponding to the screening study finding.

LEFT breast: A 0.7 x 0.2 x 0.6 cm circumscribed oval hypoechoic
parallel mass containing calcifications at the 9 o'clock position 1
cm from the nipple, corresponding to the screening study finding.
IMPRESSION: 1. Likely benign masses/probable fibroadenomas within both breasts
corresponding to the screening study findings. Six-month follow-up
recommended to ensure stability.

RECOMMENDATION:
Bilateral breast ultrasound in 6 months.

I have discussed the findings and recommendations with the patient.
If applicable, a reminder letter will be sent to the patient
regarding the next appointment.

BI-RADS CATEGORY  3: Probably benign.

## 2022-10-12 ENCOUNTER — Ambulatory Visit
Admission: RE | Admit: 2022-10-12 | Discharge: 2022-10-12 | Disposition: A | Discharge status: Home / Self Care | Payer: Managed Care, Other (non HMO) | Source: Ambulatory Visit | Attending: Obstetrics | Admitting: Obstetrics

## 2022-10-12 ENCOUNTER — Other Ambulatory Visit: Payer: Self-pay | Admitting: Obstetrics

## 2022-10-12 DIAGNOSIS — N6320 Unspecified lump in the left breast, unspecified quadrant: Secondary | ICD-10-CM

## 2022-10-12 DIAGNOSIS — N6310 Unspecified lump in the right breast, unspecified quadrant: Secondary | ICD-10-CM

## 2023-03-13 ENCOUNTER — Ambulatory Visit
Admission: RE | Admit: 2023-03-13 | Discharge: 2023-03-13 | Disposition: A | Discharge status: Home / Self Care | Payer: Managed Care, Other (non HMO) | Source: Ambulatory Visit | Attending: Obstetrics

## 2023-03-13 ENCOUNTER — Ambulatory Visit
Admission: RE | Admit: 2023-03-13 | Discharge: 2023-03-13 | Disposition: A | Discharge status: Home / Self Care | Payer: 59 | Source: Ambulatory Visit | Attending: Obstetrics | Admitting: Obstetrics

## 2023-03-13 ENCOUNTER — Other Ambulatory Visit: Payer: Managed Care, Other (non HMO)

## 2023-03-13 ENCOUNTER — Ambulatory Visit
Admission: RE | Admit: 2023-03-13 | Discharge: 2023-03-13 | Disposition: A | Discharge status: Home / Self Care | Payer: Managed Care, Other (non HMO) | Source: Ambulatory Visit | Attending: Obstetrics | Admitting: Obstetrics

## 2023-03-13 DIAGNOSIS — N6310 Unspecified lump in the right breast, unspecified quadrant: Secondary | ICD-10-CM

## 2023-03-13 DIAGNOSIS — N6320 Unspecified lump in the left breast, unspecified quadrant: Secondary | ICD-10-CM

## 2024-03-09 ENCOUNTER — Other Ambulatory Visit: Payer: Self-pay | Admitting: Obstetrics

## 2024-03-09 DIAGNOSIS — N63 Unspecified lump in unspecified breast: Secondary | ICD-10-CM

## 2024-04-08 ENCOUNTER — Ambulatory Visit
Admission: RE | Admit: 2024-04-08 | Discharge: 2024-04-08 | Disposition: A | Discharge status: Home / Self Care | Source: Ambulatory Visit | Attending: Obstetrics

## 2024-04-08 ENCOUNTER — Ambulatory Visit
Admission: RE | Admit: 2024-04-08 | Discharge: 2024-04-08 | Disposition: A | Discharge status: Home / Self Care | Source: Ambulatory Visit | Attending: Obstetrics | Admitting: Obstetrics

## 2024-04-08 DIAGNOSIS — N63 Unspecified lump in unspecified breast: Secondary | ICD-10-CM
